# Patient Record
Sex: Male | Born: 1970 | Race: White | Hispanic: No | Marital: Single | Smoking: Current every day smoker
Health system: Southern US, Community
[De-identification: ages and names within clinical notes are randomized; demographics above are authoritative.]

## PROBLEM LIST (undated history)

## (undated) DIAGNOSIS — M5430 Sciatica, unspecified side: Secondary | ICD-10-CM

---

## 2017-04-02 ENCOUNTER — Emergency Department (HOSPITAL_COMMUNITY)
Admission: EM | Admit: 2017-04-02 | Discharge: 2017-04-02 | Disposition: A | Discharge status: Home / Self Care | Payer: Self-pay | Attending: Emergency Medicine | Admitting: Emergency Medicine

## 2017-04-02 ENCOUNTER — Other Ambulatory Visit: Payer: Self-pay

## 2017-04-02 ENCOUNTER — Emergency Department (HOSPITAL_COMMUNITY): Payer: Self-pay

## 2017-04-02 ENCOUNTER — Encounter (HOSPITAL_COMMUNITY): Payer: Self-pay

## 2017-04-02 DIAGNOSIS — F17210 Nicotine dependence, cigarettes, uncomplicated: Secondary | ICD-10-CM | POA: Insufficient documentation

## 2017-04-02 DIAGNOSIS — M545 Low back pain, unspecified: Secondary | ICD-10-CM

## 2017-04-02 DIAGNOSIS — G8929 Other chronic pain: Secondary | ICD-10-CM | POA: Insufficient documentation

## 2017-04-02 HISTORY — DX: Sciatica, unspecified side: M54.30

## 2017-04-02 MED ORDER — PREDNISONE 10 MG PO TABS: ORAL_TABLET | ORAL | 0 refills | Status: DC

## 2017-04-02 MED ORDER — CYCLOBENZAPRINE HCL 10 MG PO TABS: 10.0000 mg | ORAL_TABLET | Freq: Two times a day (BID) | ORAL | 0 refills | Status: DC | PRN

## 2017-04-02 NOTE — Discharge Instructions (Signed)
You can take Tylenol or Ibuprofen as directed for pain. You can alternate Tylenol and Ibuprofen every 4 hours. If you take Tylenol at 1pm, then you can take Ibuprofen at 5pm. Then you can take Tylenol again at 9pm.   Take Flexeril as prescribed. This medication will make you drowsy so do not drive or drink alcohol when taking it.  Take prednisone as directed.   [facility].  Follow-up with referral to neurosurgery for outpatient evaluation and further potential imaging.  Return to the Emergency Department immediately for any worsening back pain, neck pain, difficulty walking, numbness/weaknss of your arms or legs, urinary or bowel accidents, fever or any other worsening or concerning symptoms.

## 2017-04-02 NOTE — ED Provider Notes (Signed)
[facility] Provider Note   CSN: [id number] Arrival date & time: 04/02/17  2037     History   Chief Complaint Chief Complaint  Patient presents with  . Back Pain  . Sciatica    HPI Gabriel Sharp is a 46 y.o. male who presents with chronic left-sided back pain.  Patient reports that this is been ongoing for several years.  He was initially evaluated onset of symptoms and diagnosed with sciatica.  Patient reports that he has intermittent flares of left-sided back pain that radiates down the posterior aspect of his left leg.  Patient reports that he recently started a new job that requires a lot of physical activity and noticed that exacerbated his back pain.  Patient reports he has been taking ibuprofen for pain relief with minimal improvement.  Patient states that he has not had any trauma, injury, fall.  Patient reports that he has had some intermittent numbness to the left lower extremity but denies any current numbness at this time.  Patient states that this numbness is not new has been ongoing since he was initially diagnosed with sciatica.  Patient reports that he is able to ambulate but states that sometimes his pain is worsened with ambulation. Denies fevers, weight loss, numbness/weakness of upper and lower extremities, bowel/bladder incontinence, saddle anesthesia, history of back surgery, history of IVDA.  Patient denies any chest pain, difficulty breathing, abdominal pain, nausea/vomiting.   The history is provided by the patient.    Past Medical History:  Diagnosis Date  . Sciatica     There are no active problems to display for this patient.   History reviewed. No pertinent surgical history.     Home Medications    Prior to Admission medications   Medication Sig Start Date End Date Taking? Authorizing Provider  acetaminophen (TYLENOL) 325 MG tablet Take 975 mg every 6 (six) hours as needed by mouth for mild pain.   Yes  [provider]  ibuprofen (ADVIL,MOTRIN) 200 MG tablet Take 400-600 mg daily by mouth.   Yes [provider]  cyclobenzaprine (FLEXERIL) 10 MG tablet Take 1 tablet (10 mg total) 2 (two) times daily as needed by mouth for muscle spasms. 04/02/17   Maxwell CaulLayden, Amana Bouska A, PA-C  predniSONE (DELTASONE) 10 MG tablet Take 6 tabs day 1, 5 tabs day 2, 4 tabs day 3, 3 tabs day 4, 2 tabs day 5, and 1 on day 6 04/02/17   Maxwell CaulLayden, Desha Bitner A, PA-C    Family History History reviewed. No pertinent family history.  Social History Social History   Tobacco Use  . Smoking status: Current Every Day Smoker    Packs/day: 1.00    Types: Cigarettes  Substance Use Topics  . Alcohol use: Yes    Frequency: Never    Comment: occ  . Drug use: No     Allergies   Patient has no known allergies.   Review of Systems Review of Systems  Constitutional: Negative for fever and unexpected weight change.  Respiratory: Negative for shortness of breath.   Cardiovascular: Negative for chest pain.  Gastrointestinal: Negative for abdominal pain.  Musculoskeletal: Positive for back pain. Negative for neck pain.  Neurological: Positive for numbness (intermittent).     Physical Exam Updated Vital Signs BP (!) 126/94 (BP Location: Right Arm)   Pulse 89   Temp 98 F (36.7 C) (Oral)   Resp 14   Ht 5\' 11"  (1.803 m)   Wt 65.8 kg (145  lb)   SpO2 99%   BMI 20.22 kg/m   Physical Exam  Constitutional: He is oriented to person, place, and time. He appears well-developed and well-nourished.  Sitting comfortably on examination table  HENT:  Head: Normocephalic and atraumatic.  Mouth/Throat: Oropharynx is clear and moist and mucous membranes are normal.  Eyes: Conjunctivae, EOM and lids are normal. Pupils are equal, round, and reactive to light. Right eye exhibits no discharge. Left eye exhibits no discharge. No scleral icterus.  Neck: Full passive range of motion without pain.  Full flexion/extension  and lateral movement of neck fully intact. No bony midline tenderness. No deformities or crepitus.   Cardiovascular: Normal rate, regular rhythm, normal heart sounds and normal pulses.  Pulses:      Dorsalis pedis pulses are 2+ on the right side, and 2+ on the left side.  Pulmonary/Chest: Effort normal and breath sounds normal.  Abdominal: Soft. Normal appearance. There is no tenderness. There is no rigidity and no guarding.  Musculoskeletal: Normal range of motion.       Cervical back: He exhibits no tenderness.       Thoracic back: He exhibits no tenderness.       Lumbar back: He exhibits no tenderness.       Back:  No midline C, T, L-spine tenderness.  No deformities or crepitus noted.  Diffuse muscular tenderness overlying the left gluteal region.  Flexion/extension of back intact without difficulty.  Patient reports that pain radiates down posterior aspect of the left leg.  No tenderness palpation to left knee, left tib-fib.   Neurological: He is alert and oriented to person, place, and time.  Reflex Scores:      Patellar reflexes are 2+ on the right side and 2+ on the left side. Follows commands, Moves all extremities  5/5 strength to BUE and BLE  Sensation intact throughout all major nerve distributions of the BLE Normal gait  Skin: Skin is warm and dry. Capillary refill takes less than 2 seconds.  LLE is not dusky in appearance or cool to touch.  Psychiatric: He has a normal mood and affect. His speech is normal and behavior is normal.  Nursing note and vitals reviewed.    ED Treatments / Results  Labs (all labs ordered are listed, but only abnormal results are displayed) Labs Reviewed - No data to display  EKG  EKG Interpretation None       Radiology Dg Lumbar Spine Complete  Result Date: 04/02/2017 CLINICAL DATA:  Chronic low back pain.  Sciatica. EXAM: LUMBAR SPINE - COMPLETE 4+ VIEW COMPARISON:  None. FINDINGS: Five non rib-bearing lumbar-type vertebral bodies  are intact and aligned with maintenance of the lumbar lordosis. Moderate L3-4 disc height loss with endplate spurring, mild L4-5. No destructive bony lesions. Sacroiliac joints are symmetric. Included prevertebral and paraspinal soft tissue planes are non-suspicious. Phleboliths project in the pelvis. IMPRESSION: Moderate mid lumbar spondylosis. Electronically Signed   By: Awilda Metroourtnay  Bloomer M.D.   On: 04/02/2017 22:58    Procedures Procedures (including critical care time)  Medications Ordered in ED Medications - No data to display   Initial Impression / Assessment and Plan / ED Course  I have reviewed the triage vital signs and the nursing notes.  Pertinent labs & imaging results that were available during my care of the patient were reviewed by me and considered in my medical decision making (see chart for details).     46 year old male who presents with chronic left-sided back pain  and pain that radiates down the posterior aspect of his left leg.  Diagnosed with sciatica 2 years ago and initially symptoms presented.  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  Patient reports that he has some intermittent/tingling to the left lower extremity.  He denies any numbness or tingling today and states that this numbness has not been new that is been going on for the last 2 years.  No weakness.  Still I am able to ambulate without any difficulty.  No red flag symptoms.  No neuro deficits noted on exam. Pulses are equal in bilateral lower extremities.  They are well-perfused with good distal cap refill.  No evidence of dusky appearance or cool to touch.  Do not suspect arterial occlusion or aortic dissection based on history/physical exam.  History/physical exam not concerning for cauda equina or spinal abscess.  Patient denies any new changes noticed denies any numbness tonight.  No  neuro deficits noted on exam.  Given reassuring history/physical exam, I do not feel that patient needs an emergent MRI at this point.  I discussed at length with patient that at some point he may need an outpatient MRI for full evaluation of back pain.  Consider sciatica versus muscular strain versus chronic low back pain.  Review of patient's records that he brought with him from previous hospital shows diagnosis of sciatica.  He had no imaging done at that time. Will obtain lumbar imaging for further evaluation.  X-rays reviewed.  Negative for any acute fracture or dislocation.  No evidence of any acute abnormalities.  Because results with patient.  Will plan to treat as sciatica.  Patient instructed to [facility].  Patient had ample opportunity for questions and discussion. All patient's questions were answered with full understanding. Strict return precautions discussed. Patient expresses understanding and agreement to plan.    Final Clinical Impressions(s) / ED Diagnoses   Final diagnoses:  Chronic left-sided low back pain without sciatica    ED Discharge Orders        Ordered    predniSONE (DELTASONE) 10 MG tablet     04/02/17 2226    cyclobenzaprine (FLEXERIL) 10 MG tablet  2 times daily PRN     04/02/17 2226       Maxwell Caul, PA-C 04/03/17 1827    Loren Racer, MD 04/07/17 1501

## 2017-04-02 NOTE — ED Triage Notes (Signed)
Pt endorses lower back pain with radiation down the left leg and has been diagnosed with sciatica. Pt just moved here and does not have a dr. VSS. Pt ambulatory.

## 2017-04-02 NOTE — ED Notes (Signed)
Patient is A&Ox4 at this time.  Patient in no signs of distress.  Please see providers note for complete history and physical exam.  

## 2017-04-23 ENCOUNTER — Other Ambulatory Visit: Payer: Self-pay

## 2017-04-23 ENCOUNTER — Encounter (HOSPITAL_COMMUNITY): Payer: Self-pay | Admitting: Emergency Medicine

## 2017-04-23 DIAGNOSIS — M545 Low back pain: Secondary | ICD-10-CM | POA: Insufficient documentation

## 2017-04-23 DIAGNOSIS — Z5321 Procedure and treatment not carried out due to patient leaving prior to being seen by health care provider: Secondary | ICD-10-CM | POA: Insufficient documentation

## 2017-04-23 NOTE — ED Triage Notes (Signed)
Patient to ED c/o worsening lower back pain - pt was seen and treated a few weeks ago, x-ray showed disc degeneration L3/L4. Patient states they were told to see a neurologist, but pt doesn't have a PCP and had trouble getting a referral. Patient was told he needs MRI of his lumbar spine. Pt reports numbness and tingling to L leg (not new). Ambulatory with slow, steady gait. Denies urinary or bowel incontinence.

## 2017-04-24 ENCOUNTER — Emergency Department (HOSPITAL_COMMUNITY)
Admission: EM | Admit: 2017-04-24 | Discharge: 2017-04-24 | Disposition: A | Discharge status: Home / Self Care | Payer: Self-pay | Attending: Emergency Medicine | Admitting: Emergency Medicine

## 2017-04-24 DIAGNOSIS — Z5321 Procedure and treatment not carried out due to patient leaving prior to being seen by health care provider: Secondary | ICD-10-CM

## 2017-04-24 NOTE — ED Provider Notes (Signed)
Gabriel Sharp is a 46 y.o. male with a PMHx of sciatica, who presented to the ED with complaints of back pain, per nursing notes. Chart review reveals that he was seen on 04/02/17 in the ED for similar complaints, had lumbar xray which showed moderate mid-lumbar spondylosis; he was given flexeril and prednisone and discharged with recommendations to f[facility] and for further care of his chronic back pain.   Pt was roomed in Pod B/D and I assigned myself to his care, reviewed his chart, but then his name disappeared from the board. Per nursing notes, pt had left without being seen, after being triaged. PLEASE NOTE THAT HE WAS NOT SEEN BY MYSELF OR ANY OTHER EDP since he left after being triaged. No labs/imaging were ordered during this visit, I do not see a reason to call him back for emergent evaluation tonight.    [address], [place], [place] 04/24/17 [id number]    Dione BoozeGlick, David, MD 04/24/17 [phone]

## 2017-04-24 NOTE — ED Notes (Signed)
Called for patient x 3 with no response.  Rodman PickleBreana, NT states patient stated he was leaving earlier due to needing to work in the morning.  Will d/c.

## 2017-05-20 NOTE — Progress Notes (Signed)
Patient ID: Gabriel Sharp, male   DOB: 07/12/1970, 47 y.o.   MRN: [id number]     Gabriel Sharp, is a 47 y.o. adult  JYN:[id number]  [id number]  DOB - 07/12/1970  Subjective:  Chief Complaint and HPI: Gabriel Sharp is a 47 y.o. adult here today to establish care and for a follow up visit After going to the ED 04/24/2017 for back pain but leaving without being seen.  He had, however been seen on 04/02/2017 for LBP and L sciatica.  L-S xray films showed moderate lumbar spondylosis.  He continues to have LBP even after trials of prednisone, flexeril, mobic and gabapentin.  Back pain initially started about 1 year ago and has occurred on and off.  Since September, it has been bothering him more often than not.  Pain seems to have been worsened since starting a new job on November.  Lots of lifting and heavy equipment and vibrating machinery.  No numbness or weakenss but he does have pain in his L leg.    ED/Hospital notes reviewed.   Social History:  Smoker, working in Adult nurseindustrial/machinery Family history:  +heart disease.  No diabetes  ROS:   Constitutional:  No f/c, No night sweats, No unexplained weight loss. EENT:  No vision changes, No blurry vision, No hearing changes. No mouth, throat, or ear problems.  Respiratory: No cough, No SOB Cardiac: No CP, no palpitations GI:  No abd pain, No N/V/D. GU: No Urinary s/sx Musculoskeletal: +LBP Neuro: No headache, no dizziness, no motor weakness.  Skin: No rash Endocrine:  No polydipsia. No polyuria.  Psych: Denies SI/HI  No problems updated.  ALLERGIES: No Known Allergies  PAST MEDICAL HISTORY: Past Medical History:  Diagnosis Date  . Sciatica     MEDICATIONS AT HOME: Prior to Admission medications   Medication Sig Start Date End Date Taking? Authorizing Provider  meloxicam (MOBIC) 15 MG tablet Take 1 tablet (15 mg total) by mouth daily. X 10 days then prn pain 05/21/17   Anders SimmondsMcClung, Leslie Jester M, PA-C  methocarbamol (ROBAXIN) 500 MG tablet  Take 1 tablet (500 mg total) by mouth 3 (three) times daily. X 10 days then prn spasm 05/21/17   Anders SimmondsMcClung, Jaysie Benthall M, PA-C     Objective:  EXAM:   Vitals:   05/21/17 0846  BP: 117/84  Pulse: 82  Resp: 18  Temp: 98.3 F (36.8 C)  TempSrc: Oral  SpO2: 97%  Weight: 150 lb (68 kg)  Height: 6' (1.829 m)    General appearance : A&OX3. NAD. Non-toxic-appearing HEENT: Atraumatic and Normocephalic.  PERRLA. EOM intact.  Neck: supple, no JVD. No cervical lymphadenopathy. No thyromegaly Chest/Lungs:  Breathing-non-labored, Good air entry bilaterally, breath sounds normal without rales, rhonchi, or wheezing  CVS: S1 S2 regular, no murmurs, gallops, rubs  Extremities: Bilateral Lower Ext shows no edema, both legs are warm to touch with = pulse throughout Back:  No spiny TTP.  There is paraspinus spasm in the lumbar region.  +TTP over the S-I joint on the L.  Neg SLR B.  DTR=1+ BLE Neurology:  CN II-XII grossly intact, Non focal.   Psych:  TP linear. J/I WNL. Normal speech. Appropriate eye contact and affect.  Skin:  No Rash  Data Review No results found for: HGBA1C   Assessment & Plan   1. Chronic left-sided low back pain with left-sided sciatica No red flags.  xrays reviewed.  If no improvemnet, consider PT or MRI.  Patient is going to work on orange card -voltaren  75mg  bid X 10days then prn pain - methocarbamol (ROBAXIN) 500 MG tablet; Take 1 tablet (500 mg total) by mouth 3 (three) times daily. X 10 days then prn spasm  Dispense: 90 tablet; Refill: 0  2. Screening for diabetes mellitus Not diabetic in the event we need to prescribe steroids again - Glucose (CBG)   Patient have been counseled extensively about nutrition and exercise  Return in about 3 weeks (around 06/11/2017) for assign PCP; recheck LBP.  The patient was given clear instructions to go to ER or return to medical center if symptoms don't improve, worsen or new problems develop. The patient verbalized understanding.  The patient was told to call to get lab results if they haven't heard anything in the next week.     Georgian Co, PA-C [facility] [place], [place] [phone]   05/21/2017, 9:03 AM

## 2017-05-21 ENCOUNTER — Ambulatory Visit: Payer: Self-pay | Attending: Internal Medicine | Admitting: Physician Assistant

## 2017-05-21 VITALS — BP 117/84 | HR 82 | Temp 98.3°F | Resp 18 | Ht 72.0 in | Wt 150.0 lb

## 2017-05-21 DIAGNOSIS — G8929 Other chronic pain: Secondary | ICD-10-CM

## 2017-05-21 DIAGNOSIS — M5442 Lumbago with sciatica, left side: Secondary | ICD-10-CM | POA: Insufficient documentation

## 2017-05-21 DIAGNOSIS — Z131 Encounter for screening for diabetes mellitus: Secondary | ICD-10-CM | POA: Insufficient documentation

## 2017-05-21 DIAGNOSIS — F17200 Nicotine dependence, unspecified, uncomplicated: Secondary | ICD-10-CM | POA: Insufficient documentation

## 2017-05-21 LAB — GLUCOSE, POCT (MANUAL RESULT ENTRY): POC GLUCOSE: 98 mg/dL (ref 70–99)

## 2017-05-21 MED ORDER — MELOXICAM 15 MG PO TABS: 15.0000 mg | ORAL_TABLET | Freq: Every day | ORAL | 1 refills | Status: DC

## 2017-05-21 MED ORDER — DICLOFENAC SODIUM 75 MG PO TBEC: 75.0000 mg | DELAYED_RELEASE_TABLET | Freq: Two times a day (BID) | ORAL | 1 refills | Status: DC

## 2017-05-21 MED ORDER — METHOCARBAMOL 500 MG PO TABS: 500.0000 mg | ORAL_TABLET | Freq: Three times a day (TID) | ORAL | 0 refills | Status: DC

## 2017-05-21 NOTE — Patient Instructions (Addendum)
Sacroiliac Joint Dysfunction Sacroiliac joint dysfunction is a condition that causes inflammation on one or both sides of the sacroiliac (SI) joint. The SI joint connects the lower part of the spine (sacrum) with the two upper portions of the pelvis (ilium). This condition causes deep aching or burning pain in the low back. In some cases, the pain may also spread into one or both buttocks or hips or spread down the legs. What are the causes? This condition may be caused by:  Pregnancy. During pregnancy, extra stress is put on the SI joints because the pelvis widens.  Injury, such as: ? Car accidents. ? Sport-related injuries. ? Work-related injuries.  Having one leg that is shorter than the other.  Conditions that affect the joints, such as: ? Rheumatoid arthritis. ? Gout. ? Psoriatic arthritis. ? Joint infection (septic arthritis).  Sometimes, the cause of SI joint dysfunction is not known. What are the signs or symptoms? Symptoms of this condition include:  Aching or burning pain in the lower back. The pain may also spread to other areas, such as: ? Buttocks. ? Groin. ? Thighs and legs.  Muscle spasms in or around the painful areas.  Increased pain when standing, walking, running, stair climbing, bending, or lifting.  How is this diagnosed? Your health care provider will do a physical exam and take your medical history. During the exam, the health care provider may move one or both of your legs to different positions to check for pain. Various tests may be done to help verify the diagnosis, including:  Imaging tests to look for other causes of pain. These may include: ? MRI. ? CT scan. ? Bone scan.  Diagnostic injection. A numbing medicine is injected into the SI joint using a needle. If the pain is temporarily improved or stopped after the injection, this can indicate that SI joint dysfunction is the problem.  How is this treated? Treatment may vary depending on the  cause and severity of your condition. Treatment options may include:  Applying ice or heat to the lower back area. This can help to reduce pain and muscle spasms.  Medicines to relieve pain or inflammation or to relax the muscles.  Wearing a back brace (sacroiliac brace) to help support the joint while your back is healing.  Physical therapy to increase muscle strength around the joint and flexibility at the joint. This may also involve learning proper body positions and ways of moving to relieve stress on the joint.  Direct manipulation of the SI joint.  Injections of steroid medicine into the joint in order to reduce pain and swelling.  Radiofrequency ablation to burn away nerves that are carrying pain messages from the joint.  Use of a device that provides electrical stimulation in order to reduce pain at the joint.  Surgery to put in screws and plates that limit or prevent joint motion. This is rare.  Follow these instructions at home:  Rest as needed. Limit your activities as directed by your health care provider.  Take medicines only as directed by your health care provider.  If directed, apply ice to the affected area: ? Put ice in a plastic bag. ? Place a towel between your skin and the bag. ? Leave the ice on for 20 minutes, 2-3 times per day.  Use a heating pad or a moist heat pack as directed by your health care provider.  Exercise as directed by your health care provider or physical therapist.  Keep all follow-up visits   as directed by your health care provider. This is important. Contact a health care provider if:  Your pain is not controlled with medicine.  You have a fever.  You have increasingly severe pain. Get help right away if:  You have weakness, numbness, or tingling in your legs or feet.  You lose control of your bladder or bowel. This information is not intended to replace advice given to you by your health care provider. Make sure you discuss  any questions you have with your health care provider. Document Released: 08/01/2008 Document Revised: 10/11/2015 Document Reviewed: 01/10/2014 Elsevier Interactive Patient Education  2018 [employer] Low Back Sprain A sprain is a stretch or tear in the bands of tissue that hold bones and joints together (ligaments). Sprains of the lower back (lumbar spine) are a common cause of low back pain. A sprain occurs when ligaments are overextended or stretched beyond their limits. The ligaments can become inflamed, resulting in pain and sudden muscle tightening (spasms). A sprain can be caused by an injury (trauma), or it can develop gradually due to overuse. There are three types of sprains:  Grade 1 is a mild sprain involving an overstretched ligament or a very slight tear of the ligament.  Grade 2 is a moderate sprain involving a partial tear of the ligament.  Grade 3 is a severe sprain involving a complete tear of the ligament.  What are the causes? This condition may be caused by:  Trauma, such as a fall or a hit to the body.  Twisting or overstretching the back. This may result from doing activities that require a lot of energy, such as lifting heavy objects.  What increases the risk? The following factors may increase your risk of getting this condition:  Playing contact sports.  Participating in sports or activities that put excessive stress on the back and require a lot of bending and twisting, including: ? Lifting weights or heavy objects. ? Gymnastics. ? Soccer. ? Figure skating. ? Snowboarding.  Being overweight or obese.  Having poor strength and flexibility.  What are the signs or symptoms? Symptoms of this condition may include:  Sharp or dull pain in the lower back that does not go away. Pain may extend to the buttocks.  Stiffness.  Limited range of motion.  Inability to stand up straight due to stiffness or pain.  Muscle spasms.  How is this  diagnosed?  This condition may be diagnosed based on:  Your symptoms.  Your medical history.  A physical exam. ? Your health care provider may push on certain areas of your back to determine the source of your pain. ? You may be asked to bend forward, backward, and side to side to assess the severity of your pain and your range of motion.  Imaging tests, such as: ? X-rays. ? MRI.  How is this treated? Treatment for this condition may include:  Applying heat and cold to the affected area.  Medicines to help relieve pain and to relax your muscles (muscle relaxants).  NSAIDs to help reduce swelling and discomfort.  Physical therapy.  When your symptoms improve, it is important to gradually return to your normal routine as soon as possible to reduce pain, avoid stiffness, and avoid loss of muscle strength. Generally, symptoms should improve within 6 weeks of treatment. However, recovery time varies. Follow these instructions at home: Managing pain, stiffness, and swelling  If directed, apply ice to the injured area during the first 24 hours after your  injury. ? Put ice in a plastic bag. ? Place a towel between your skin and the bag. ? Leave the ice on for 20 minutes, 2-3 times a day.  If directed, apply heat to the affected area as often as told by your health care provider. Use the heat source that your health care provider recommends, such as a moist heat pack or a heating pad. ? Place a towel between your skin and the heat source. ? Leave the heat on for 20-30 minutes. ? Remove the heat if your skin turns bright red. This is especially important if you are unable to feel pain, heat, or cold. You may have a greater risk of getting burned. Activity  Rest and return to your normal activities as told by your health care provider. Ask your health care provider what activities are safe for you.  Avoid activities that take a lot of effort (are strenuous) for as long as told by  your health care provider.  Do exercises as told by your health care provider. General instructions   Take over-the-counter and prescription medicines only as told by your health care provider.  If you have questions or concerns about safety while taking pain medicine, talk with your health care provider.  Do not drive or operate heavy machinery until you know how your pain medicine affects you.  Do not use any tobacco products, such as cigarettes, chewing tobacco, and e-cigarettes. Tobacco can delay bone healing. If you need help quitting, ask your health care provider.  Keep all follow-up visits as told by your health care provider. This is important. How is this prevented?  Warm up and stretch before being active.  Cool down and stretch after being active.  Give your body time to rest between periods of activity.  Avoid: ? Being physically inactive for long periods at a time. ? Exercising or playing sports when you are tired or in pain.  Use correct form when playing sports and lifting heavy objects.  Use good posture when sitting and standing.  Maintain a healthy weight.  Sleep on a mattress with medium firmness to support your back.  Make sure to use equipment that fits you, including shoes that fit well.  Be safe and responsible while being active to avoid falls.  Do at least 150 minutes of moderate-intensity exercise each week, such as brisk walking or water aerobics. Try a form of exercise that takes stress off your back, such as swimming or stationary cycling.  Maintain physical fitness, including: ? Strength. In particular, develop and maintain strong abdominal muscles. ? Flexibility. ? Cardiovascular fitness. ? Endurance. Contact a health care provider if:  Your back pain does not improve after 6 weeks of treatment.  Your symptoms get worse. Get help right away if:  Your back pain is severe.  You are unable to stand or walk.  You develop pain in  your legs.  You develop weakness in your buttocks or legs.  You have difficulty controlling when you urinate or when you have a bowel movement. This information is not intended to replace advice given to you by your health care provider. Make sure you discuss any questions you have with your health care provider. Document Released: 05/05/2005 Document Revised: 01/10/2016 Document Reviewed: 02/14/2015 Elsevier Interactive Patient Education  [employer].

## 2017-06-12 ENCOUNTER — Ambulatory Visit: Payer: Self-pay | Attending: Nurse Practitioner | Admitting: Nurse Practitioner

## 2017-06-12 ENCOUNTER — Encounter: Payer: Self-pay | Admitting: Nurse Practitioner

## 2017-06-12 ENCOUNTER — Ambulatory Visit: Payer: Self-pay

## 2017-06-12 VITALS — BP 143/93 | HR 81 | Temp 98.0°F | Resp 12 | Ht 71.0 in | Wt 150.2 lb

## 2017-06-12 DIAGNOSIS — M5442 Lumbago with sciatica, left side: Secondary | ICD-10-CM | POA: Insufficient documentation

## 2017-06-12 DIAGNOSIS — F17200 Nicotine dependence, unspecified, uncomplicated: Secondary | ICD-10-CM | POA: Insufficient documentation

## 2017-06-12 DIAGNOSIS — R030 Elevated blood-pressure reading, without diagnosis of hypertension: Secondary | ICD-10-CM | POA: Insufficient documentation

## 2017-06-12 DIAGNOSIS — G8929 Other chronic pain: Secondary | ICD-10-CM

## 2017-06-12 DIAGNOSIS — Z716 Tobacco abuse counseling: Secondary | ICD-10-CM | POA: Insufficient documentation

## 2017-06-12 DIAGNOSIS — Z79899 Other long term (current) drug therapy: Secondary | ICD-10-CM | POA: Insufficient documentation

## 2017-06-12 DIAGNOSIS — M5440 Lumbago with sciatica, unspecified side: Secondary | ICD-10-CM

## 2017-06-12 MED ORDER — DULOXETINE HCL 30 MG PO CPEP: ORAL_CAPSULE | ORAL | 0 refills | Status: DC

## 2017-06-12 MED ORDER — DULOXETINE HCL 30 MG PO CPEP: ORAL_CAPSULE | ORAL | 3 refills | Status: DC

## 2017-06-12 NOTE — Progress Notes (Signed)
Assessment & Plan:  Gabriel Sharp was seen today for establish care.  Diagnoses and all orders for this visit:  Elevated blood pressure reading DASH/Mediterranean Diets are healthier choices for HTN.  STOP SMOKING  Chronic left-sided low back pain with left-sided sciatica -     DULoxetine (CYMBALTA) 30 MG capsule; Take one (1) capsule by mouth once daily for one week then increase to 2 (two) tablets once a day after one week.   Tobacco dependence Gabriel Sharp was counseled on the dangers of tobacco use, and was advised to quit. Reviewed strategies to maximize success, including removing cigarettes and smoking materials from environment, stress management and support of family/friends as well as pharmacological alternatives including: Wellbutrin, Chantix, Nicotine patch, Nicotine gum or lozenges. Smoking cessation support: smoking cessation hotline: 1-800-QUIT-NOW.  Smoking cessation classes are also available through [facility]. Call [phone] or visit our website at [email or web address].   Spent 3 minutes counseling on smoking cessation and patient is not ready to quit.  Patient has been counseled on age-appropriate routine health concerns for screening and prevention. These are reviewed and up-to-date. Referrals have been placed accordingly. Immunizations are up-to-date or declined.    Subjective:   Chief Complaint  Patient presents with  . Establish Care    Patient is here to establish care for his lower back pain. Patient stated the pain radiates down to his leg feel like needles poking. Patient stated the lower back pain started since 2017 and is ongoing.    HPI Gabriel Sharp 47 y.o. male presents to office today to establish care. He has complaints of back pain and has a history of sciatica based on review of records. He is here today with his ex girlfriend whom he is currently living with. He has no other significant PMH.   Back Pain Patient presents for presents  evaluation of low back problems.  Symptoms have been present for 1 year and include back pain with sciatica. Initial inciting event: he had a skateboarding accident at the age of 12 and reports "sprained/fractured discs" in the lower back. He did not have physical therapy thereafter or any type of rehab or orthopedic consult.  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 He does not wear a back brace. He reports low back pain with stabbing pain in "my left butt cheek" then it radiates down to his left leg with numbness and burning.   Symptoms are worst: all day. Alleviating factors identifiable by patient are rest. Exacerbating factors identifiable by patient are walking, standing for more than 2-3 minutes at at time, bending over. Treatments so far initiated by patient: none Previous lower back problems: see injury above. Previous workup: xrays. . Previous treatments: voltaren, robaxin, flexeril, ibuprofen, mobic, prednisone, gabapentin.   XRAY LUMBAR SPINE 04-02-2017 FINDINGS: Five non rib-bearing lumbar-type vertebral bodies are intact and aligned with maintenance of the lumbar lordosis. Moderate L3-4 disc height loss with endplate spurring, mild L4-5. No destructive bony lesions.  Sacroiliac joints are symmetric. Included prevertebral and paraspinal soft tissue planes are non-suspicious. Phleboliths project in the pelvis.  IMPRESSION: Moderate mid lumbar spondylosis.  Elevated blood pressure He does not have a history of hypertension. Could likely be related to pain. Will recheck at next office visit in a few weeks. Denies chest pain, shortness of breath, palpitations, lightheadedness, dizziness, headaches or BLE edema.     Review of Systems  Constitutional: Negative for fever, malaise/fatigue and weight loss.  HENT: Negative  for congestion and sinus pain.        Rhinorrhea  Eyes: Negative.   Respiratory: Positive for cough. Negative for sputum  production, shortness of breath and wheezing.   Cardiovascular: Negative.  Negative for chest pain, orthopnea and leg swelling.  Gastrointestinal: Negative for abdominal pain.  Genitourinary: Negative.   Musculoskeletal: Positive for back pain and myalgias.       SEE HPI  Neurological: Positive for tingling, sensory change and weakness. Negative for dizziness, focal weakness, seizures, loss of consciousness and headaches.  Psychiatric/Behavioral: Negative.  Negative for depression, hallucinations, substance abuse and suicidal ideas. The patient is not nervous/anxious.     Past Medical History:  Diagnosis Date  . Sciatica     History reviewed. No pertinent surgical history.  Family History  Problem Relation Age of Onset  . Heart disease Mother   . Heart disease Maternal Uncle   . Heart disease Paternal Uncle   . Heart disease Maternal Grandmother   . Heart disease Paternal Grandmother     Social History Reviewed with no changes to be made today.   Outpatient Medications Prior to Visit  Medication Sig Dispense Refill  . diclofenac (VOLTAREN) 75 MG EC tablet Take 1 tablet (75 mg total) by mouth 2 (two) times daily. X 10 days then prn pain 60 tablet 1  . methocarbamol (ROBAXIN) 500 MG tablet Take 1 tablet (500 mg total) by mouth 3 (three) times daily. X 10 days then prn spasm 90 tablet 0   No facility-administered medications prior to visit.     No Known Allergies     Objective:    BP (!) 143/93 (BP Location: Right Arm, Patient Position: Sitting, Cuff Size: Normal)   Pulse 81   Temp 98 F (36.7 C) (Oral)   Resp 12   Ht 5\' 11"  (1.803 m)   Wt 150 lb 3.2 oz (68.1 kg)   SpO2 97%   BMI 20.95 kg/m  Wt Readings from Last 3 Encounters:  06/12/17 150 lb 3.2 oz (68.1 kg)  05/21/17 150 lb (68 kg)  04/23/17 152 lb (68.9 kg)    Physical Exam  Constitutional: He is oriented to person, place, and time. He appears well-developed and well-nourished. He is cooperative.  HENT:    Head: Normocephalic and atraumatic.  Eyes: EOM are normal.  Neck: Normal range of motion.  Cardiovascular: Normal rate, regular rhythm and normal heart sounds. Exam reveals no gallop and no friction rub.  No murmur heard. Pulmonary/Chest: Effort normal and breath sounds normal. No tachypnea. No respiratory distress. He has no decreased breath sounds. He has no wheezes. He has no rhonchi. He has no rales. He exhibits no tenderness.  Musculoskeletal: Normal range of motion. He exhibits no edema.       Lumbar back: He exhibits tenderness, swelling and pain. He exhibits no edema, no deformity, no laceration and no spasm.       Back:  Neurological: He is alert and oriented to person, place, and time. He has normal strength. He displays no atrophy. No sensory deficit. He exhibits normal muscle tone. Coordination normal.  Skin: Skin is warm and dry.  Psychiatric: He has a normal mood and affect. His behavior is normal. Judgment and thought content normal.  Nursing note and vitals reviewed.        Patient has been counseled extensively about nutrition and exercise as well as the importance of adherence with medications and regular follow-up. The patient was given clear instructions to go to  ER or return to medical center if symptoms don't improve, worsen or new problems develop. The patient verbalized understanding.   Follow-up: Return in about 3 weeks (around 07/03/2017) for f/u back pain and fasting labs.   Claiborne RiggZelda W Fleming, FNP-BC [facility] [place], [place] [phone]   06/12/2017, 9:38 AM

## 2017-06-12 NOTE — Patient Instructions (Addendum)
Chronic Back Pain When back pain lasts longer than 3 months, it is called chronic back pain.The cause of your back pain may not be known. Some common causes include:  Wear and tear (degenerative disease) of the bones, ligaments, or disks in your back.  Inflammation and stiffness in your back (arthritis).  People who have chronic back pain often go through certain periods in which the pain is more intense (flare-ups). Many people can learn to manage the pain with home care. Follow these instructions at home: Pay attention to any changes in your symptoms. Take these actions to help with your pain: Activity  Avoid bending and activities that make the problem worse.  Do not sit or stand in one place for long periods of time.  Take brief periods of rest throughout the day. This will reduce your pain. Resting in a lying or standing position is usually better than sitting to rest.  When you are resting for longer periods, mix in some mild activity or stretching between periods of rest. This will help to prevent stiffness and pain.  Get regular exercise. Ask your health care provider what activities are safe for you.  Do not lift anything that is heavier than 10 lb (4.5 kg). Always use proper lifting technique, which includes: ? Bending your knees. ? Keeping the load close to your body. ? Avoiding twisting. Managing pain  If directed, apply ice to the painful area. Your health care provider may recommend applying ice during the first 24-48 hours after a flare-up begins. ? Put ice in a plastic bag. ? Place a towel between your skin and the bag. ? Leave the ice on for 20 minutes, 2-3 times per day.  After icing, apply heat to the affected area as often as told by your health care provider. Use the heat source that your health care provider recommends, such as a moist heat pack or a heating pad. ? Place a towel between your skin and the heat source. ? Leave the heat on for 20-30  minutes. ? Remove the heat if your skin turns bright red. This is especially important if you are unable to feel pain, heat, or cold. You may have a greater risk of getting burned.  Try soaking in a warm tub.  Take over-the-counter and prescription medicines only as told by your health care provider.  Keep all follow-up visits as told by your health care provider. This is important. Contact a health care provider if:  You have pain that is not relieved with rest or medicine. Get help right away if:  You have weakness or numbness in one or both of your legs or feet.  You have trouble controlling your bladder or your bowels.  You have nausea or vomiting.  You have pain in your abdomen.  You have shortness of breath or you faint. This information is not intended to replace advice given to you by your health care provider. Make sure you discuss any questions you have with your health care provider. Document Released: 06/12/2004 Document Revised: 09/13/2015 Document Reviewed: 10/23/2014 Elsevier Interactive Patient Education  2018 [employer]  Sciatica Sciatica is pain, numbness, weakness, or tingling along your sciatic nerve. The sciatic nerve starts in the lower back and goes down the back of each leg. Sciatica happens when this nerve is pinched or has pressure put on it. Sciatica usually goes away on its own or with treatment. Sometimes, sciatica may keep coming back (recur). Follow these instructions at home: Medicines    Take over-the-counter and prescription medicines only as told by your doctor.  Do not drive or use heavy machinery while taking prescription pain medicine. Managing pain  If directed, put ice on the affected area. ? Put ice in a plastic bag. ? Place a towel between your skin and the bag. ? Leave the ice on for 20 minutes, 2-3 times a day.  After icing, apply heat to the affected area before you exercise or as often as told by your doctor. Use the heat source  that your doctor tells you to use, such as a moist heat pack or a heating pad. ? Place a towel between your skin and the heat source. ? Leave the heat on for 20-30 minutes. ? Remove the heat if your skin turns bright red. This is especially important if you are unable to feel pain, heat, or cold. You may have a greater risk of getting burned. Activity  Return to your normal activities as told by your doctor. Ask your doctor what activities are safe for you. ? Avoid activities that make your sciatica worse.  Take short rests during the day. Rest in a lying or standing position. This is usually better than sitting to rest. ? When you rest for a long time, do some physical activity or stretching between periods of rest. ? Avoid sitting for a long time without moving. Get up and move around at least one time each hour.  Exercise and stretch regularly, as told by your doctor.  Do not lift anything that is heavier than 10 lb (4.5 kg) while you have symptoms of sciatica. ? Avoid lifting heavy things even when you do not have symptoms. ? Avoid lifting heavy things over and over.  When you lift objects, always lift in a way that is safe for your body. To do this, you should: ? Bend your knees. ? Keep the object close to your body. ? Avoid twisting. General instructions  Use good posture. ? Avoid leaning forward when you are sitting. ? Avoid hunching over when you are standing.  Stay at a healthy weight.  Wear comfortable shoes that support your feet. Avoid wearing high heels.  Avoid sleeping on a mattress that is too soft or too hard. You might have less pain if you sleep on a mattress that is firm enough to support your back.  Keep all follow-up visits as told by your doctor. This is important. Contact a doctor if:  You have pain that: ? Wakes you up when you are sleeping. ? Gets worse when you lie down. ? Is worse than the pain you have had in the past. ? Lasts longer than 4  weeks.  You lose weight for without trying. Get help right away if:  You cannot control when you pee (urinate) or poop (have a bowel movement).  You have weakness in any of these areas and it gets worse. ? Lower back. ? Lower belly (pelvis). ? Butt (buttocks). ? Legs.  You have redness or swelling of your back.  You have a burning feeling when you pee. This information is not intended to replace advice given to you by your health care provider. Make sure you discuss any questions you have with your health care provider. Document Released: 02/12/2008 Document Revised: 10/11/2015 Document Reviewed: 01/12/2015 Elsevier Interactive Patient Education  2018 [employer]  

## 2017-07-03 ENCOUNTER — Encounter: Payer: Self-pay | Admitting: Nurse Practitioner

## 2017-07-03 ENCOUNTER — Ambulatory Visit: Payer: Self-pay | Attending: Nurse Practitioner | Admitting: Nurse Practitioner

## 2017-07-03 VITALS — BP 117/80 | HR 82 | Temp 98.5°F | Ht 71.0 in | Wt 150.0 lb

## 2017-07-03 DIAGNOSIS — M549 Dorsalgia, unspecified: Secondary | ICD-10-CM | POA: Insufficient documentation

## 2017-07-03 DIAGNOSIS — Z8249 Family history of ischemic heart disease and other diseases of the circulatory system: Secondary | ICD-10-CM | POA: Insufficient documentation

## 2017-07-03 DIAGNOSIS — F17200 Nicotine dependence, unspecified, uncomplicated: Secondary | ICD-10-CM

## 2017-07-03 DIAGNOSIS — R110 Nausea: Secondary | ICD-10-CM | POA: Insufficient documentation

## 2017-07-03 DIAGNOSIS — Z0000 Encounter for general adult medical examination without abnormal findings: Secondary | ICD-10-CM

## 2017-07-03 DIAGNOSIS — M47896 Other spondylosis, lumbar region: Secondary | ICD-10-CM | POA: Insufficient documentation

## 2017-07-03 DIAGNOSIS — M47816 Spondylosis without myelopathy or radiculopathy, lumbar region: Secondary | ICD-10-CM

## 2017-07-03 DIAGNOSIS — M5136 Other intervertebral disc degeneration, lumbar region: Secondary | ICD-10-CM | POA: Insufficient documentation

## 2017-07-03 DIAGNOSIS — Z79899 Other long term (current) drug therapy: Secondary | ICD-10-CM | POA: Insufficient documentation

## 2017-07-03 MED ORDER — CYCLOBENZAPRINE HCL 10 MG PO TABS: 10.0000 mg | ORAL_TABLET | Freq: Two times a day (BID) | ORAL | 1 refills | Status: AC | PRN

## 2017-07-03 MED ORDER — IBUPROFEN 800 MG PO TABS: 800.0000 mg | ORAL_TABLET | Freq: Three times a day (TID) | ORAL | 1 refills | Status: DC | PRN

## 2017-07-03 NOTE — Patient Instructions (Addendum)
Back Exercises If you have pain in your back, do these exercises 2-3 times each day or as told by your doctor. When the pain goes away, do the exercises once each day, but repeat the steps more times for each exercise (do more repetitions). If you do not have pain in your back, do these exercises once each day or as told by your doctor. Exercises Single Knee to Chest  Do these steps 3-5 times in a row for each leg: 1. Lie on your back on a firm bed or the floor with your legs stretched out. 2. Bring one knee to your chest. 3. Hold your knee to your chest by grabbing your knee or thigh. 4. Pull on your knee until you feel a gentle stretch in your lower back. 5. Keep doing the stretch for 10-30 seconds. 6. Slowly let go of your leg and straighten it.  Pelvic Tilt  Do these steps 5-10 times in a row: 1. Lie on your back on a firm bed or the floor with your legs stretched out. 2. Bend your knees so they point up to the ceiling. Your feet should be flat on the floor. 3. Tighten your lower belly (abdomen) muscles to press your lower back against the floor. This will make your tailbone point up to the ceiling instead of pointing down to your feet or the floor. 4. Stay in this position for 5-10 seconds while you gently tighten your muscles and breathe evenly.  Cat-Cow  Do these steps until your lower back bends more easily: 1. Get on your hands and knees on a firm surface. Keep your hands under your shoulders, and keep your knees under your hips. You may put padding under your knees. 2. Let your head hang down, and make your tailbone point down to the floor so your lower back is round like the back of a cat. 3. Stay in this position for 5 seconds. 4. Slowly lift your head and make your tailbone point up to the ceiling so your back hangs low (sags) like the back of a cow. 5. Stay in this position for 5 seconds.  Press-Ups  Do these steps 5-10 times in a row: 1. Lie on your belly (face-down)  on the floor. 2. Place your hands near your head, about shoulder-width apart. 3. While you keep your back relaxed and keep your hips on the floor, slowly straighten your arms to raise the top half of your body and lift your shoulders. Do not use your back muscles. To make yourself more comfortable, you may change where you place your hands. 4. Stay in this position for 5 seconds. 5. Slowly return to lying flat on the floor.  Bridges  Do these steps 10 times in a row: 1. Lie on your back on a firm surface. 2. Bend your knees so they point up to the ceiling. Your feet should be flat on the floor. 3. Tighten your butt muscles and lift your butt off of the floor until your waist is almost as high as your knees. If you do not feel the muscles working in your butt and the back of your thighs, slide your feet 1-2 inches farther away from your butt. 4. Stay in this position for 3-5 seconds. 5. Slowly lower your butt to the floor, and let your butt muscles relax.  If this exercise is too easy, try doing it with your arms crossed over your chest. Belly Crunches  Do these steps 5-10 times in   a row: 1. Lie on your back on a firm bed or the floor with your legs stretched out. 2. Bend your knees so they point up to the ceiling. Your feet should be flat on the floor. 3. Cross your arms over your chest. 4. Tip your chin a little bit toward your chest but do not bend your neck. 5. Tighten your belly muscles and slowly raise your chest just enough to lift your shoulder blades a tiny bit off of the floor. 6. Slowly lower your chest and your head to the floor.  Back Lifts Do these steps 5-10 times in a row: 1. Lie on your belly (face-down) with your arms at your sides, and rest your forehead on the floor. 2. Tighten the muscles in your legs and your butt. 3. Slowly lift your chest off of the floor while you keep your hips on the floor. Keep the back of your head in line with the curve in your back. Look at  the floor while you do this. 4. Stay in this position for 3-5 seconds. 5. Slowly lower your chest and your face to the floor.  Contact a doctor if:  Your back pain gets a lot worse when you do an exercise.  Your back pain does not lessen 2 hours after you exercise. If you have any of these problems, stop doing the exercises. Do not do them again unless your doctor says it is okay. Get help right away if:  You have sudden, very bad back pain. If this happens, stop doing the exercises. Do not do them again unless your doctor says it is okay. This information is not intended to replace advice given to you by your health care provider. Make sure you discuss any questions you have with your health care provider. Document Released: 06/07/2010 Document Revised: 10/11/2015 Document Reviewed: 06/29/2014 Elsevier Interactive Patient Education  2018 Elsevier Inc.  Back Pain, Adult Back pain is very common. The pain often gets better over time. The cause of back pain is usually not dangerous. Most people can learn to manage their back pain on their own. Follow these instructions at home: Watch your back pain for any changes. The following actions may help to lessen any pain you are feeling:  Stay active. Start with short walks on flat ground if you can. Try to walk farther each day.  Exercise regularly as told by your doctor. Exercise helps your back heal faster. It also helps avoid future injury by keeping your muscles strong and flexible.  Do not sit, drive, or stand in one place for more than 30 minutes.  Do not stay in bed. Resting more than 1-2 days can slow down your recovery.  Be careful when you bend or lift an object. Use good form when lifting: ? Bend at your knees. ? Keep the object close to your body. ? Do not twist.  Sleep on a firm mattress. Lie on your side, and bend your knees. If you lie on your back, put a pillow under your knees.  Take medicines only as told by your  doctor.  Put ice on the injured area. ? Put ice in a plastic bag. ? Place a towel between your skin and the bag. ? Leave the ice on for 20 minutes, 2-3 times a day for the first 2-3 days. After that, you can switch between ice and heat packs.  Avoid feeling anxious or stressed. Find good ways to deal with stress, such as exercise.    Maintain a healthy weight. Extra weight puts stress on your back.  Contact a doctor if:  You have pain that does not go away with rest or medicine.  You have worsening pain that goes down into your legs or buttocks.  You have pain that does not get better in one week.  You have pain at night.  You lose weight.  You have a fever or chills. Get help right away if:  You cannot control when you poop (bowel movement) or pee (urinate).  Your arms or legs feel weak.  Your arms or legs lose feeling (numbness).  You feel sick to your stomach (nauseous) or throw up (vomit).  You have belly (abdominal) pain.  You feel like you may pass out (faint). This information is not intended to replace advice given to you by your health care provider. Make sure you discuss any questions you have with your health care provider. Document Released: 10/22/2007 Document Revised: 10/11/2015 Document Reviewed: 09/06/2013 Elsevier Interactive Patient Education  2018 Elsevier Inc.  

## 2017-07-03 NOTE — Progress Notes (Signed)
Assessment & Plan:  Gabriel Sharp was seen today for back pain and medication problem.  Diagnoses and all orders for this visit:  Spondylosis of lumbar spine -     ibuprofen (ADVIL,MOTRIN) 800 MG tablet; Take 1 tablet (800 mg total) by mouth every 8 (eight) hours as needed. -     cyclobenzaprine (FLEXERIL) 10 MG tablet; Take 1 tablet (10 mg total) by mouth 2 (two) times daily as needed for muscle spasms.  Other intervertebral disc degeneration, lumbar region -     MR Lumbar Spine Wo Contrast; Future  Routine adult health maintenance -     CBC -     Lipid panel -     Basic metabolic panel -     VITAMIN D 25 Hydroxy (Vit-D Deficiency, Fractures)  Tobacco dependence    Patient has been counseled on age-appropriate routine health concerns for screening and prevention. These are reviewed and up-to-date. Referrals have been placed accordingly. Immunizations are up-to-date or declined.    Subjective:   Chief Complaint  Patient presents with  . Back Pain    Patient is here for back pain follow-up. Patient stated the pain still there and comes and go. No improvement. Patient been taking OTC pain relieves.   . Medication Problem    Patient stated he stop taking Gabriel due to make him feels Sharp   HPI Gabriel Sharp 47 y.o. male presents to office today for follow up to back pain.   Gabriel Sharp. He was taking it with food and it did not provide relief. At his last appointment with me he was prescribed Gabriel for poorly controlled back pain. He endorses taking it for 3 days and had nausea each time so he stopped taking it. He does not wear a back brace at work. States it causes more back pain when he tries to drive the machinery at work while wearing a brace. He continues to smoke although we have spoken about the correlation between pain receptors and smoking. He declines any smoking cessation aids. Xray 03-2017 showed lumbar spondylosis.    Back  Pain Chronic >1year and include sciatica into left hip and gluteal area. Pain is located in the left lumbar region of his spine. He endorses the pain as constant. Aggravating factors: activity.  Per previous notes:  Initial inciting event: he had a skateboarding accident at the age of 78 and reports "sprained/fractured discs" in the lower back. He did not have physical therapy thereafter or any type of rehab or orthopedic consult.  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  Previous treatments: Gabriel, voltaren, robaxin, flexeril, ibuprofen, mobic, prednisone, gabapentin.   Review of Systems  Constitutional: Negative.  Negative for chills, fever, malaise/fatigue and weight loss.  Respiratory: Negative.  Negative for cough, sputum production and shortness of breath.   Cardiovascular: Negative.  Negative for chest pain, orthopnea and leg swelling.  Gastrointestinal: Negative for constipation and diarrhea.       No bladder or bowel incontinence  Musculoskeletal: Positive for back pain, joint pain and myalgias. Negative for falls.  Neurological: Positive for tingling. Negative for dizziness, speech change, focal weakness and headaches.    Past Medical History:  Diagnosis Date  . Sciatica     History reviewed. No pertinent surgical history.  Family History  Problem Relation Age of Onset  . Heart disease Mother   . Heart disease Maternal Uncle   . Heart disease Paternal  Uncle   . Heart disease Maternal Grandmother   . Heart disease Paternal Grandmother     Social History Reviewed with no changes to be made today.   Outpatient Medications Prior to Visit  Medication Sig Dispense Refill  . diclofenac (VOLTAREN) 75 MG EC tablet Take 1 tablet (75 mg total) by mouth 2 (two) times daily. X 10 days then prn pain (Patient not taking: Reported on 07/03/2017) 60 tablet 1  . DULoxetine (Gabriel) 30 MG capsule Take one (1) capsule by mouth once daily for  one week then increase to 2 (two) tablets once a day after one week. (Patient not taking: Reported on 07/03/2017) 49 capsule 0  . methocarbamol (ROBAXIN) 500 MG tablet Take 1 tablet (500 mg total) by mouth 3 (three) times daily. X 10 days then prn spasm (Patient not taking: Reported on 07/03/2017) 90 tablet 0   No facility-administered medications prior to visit.     No Known Allergies     Objective:    BP 117/80 (BP Location: Right Arm, Patient Position: Sitting, Cuff Size: Normal)   Pulse 82   Temp 98.5 F (36.9 C) (Oral)   Ht 5\' 11"  (1.803 m)   Wt 150 lb (68 kg)   SpO2 95%   BMI 20.92 kg/m  Wt Readings from Last 3 Encounters:  07/03/17 150 lb (68 kg)  06/12/17 150 lb 3.2 oz (68.1 kg)  05/21/17 150 lb (68 kg)    Physical Exam  Constitutional: He is oriented to person, place, and time. He appears well-developed and well-nourished.  HENT:  Head: Normocephalic.  Eyes: EOM are normal.  Neck: Normal range of motion.  Cardiovascular: Normal rate, regular rhythm and normal heart sounds. Exam reveals no gallop and no friction rub.  No murmur heard. Pulmonary/Chest: Effort normal and breath sounds normal. No respiratory distress. He has no wheezes. He has no rales. He exhibits no tenderness.  Musculoskeletal:       Thoracic back: He exhibits pain.       Lumbar back: He exhibits pain.       Back:  Pain with firm palpation of left thoracolumbar area  Neurological: He is alert and oriented to person, place, and time.  Skin: Skin is warm and dry.  Psychiatric: He has a normal mood and affect. His behavior is normal. Judgment and thought content normal.      Patient has been counseled extensively about nutrition and exercise as well as the importance of adherence with medications and regular follow-up. The patient was given clear instructions to go to ER or return to medical center if symptoms don't improve, worsen or new problems develop. The patient verbalized understanding.    Follow-up: Return in about 3 months (around 09/30/2017).   Claiborne RiggZelda W Noorah Giammona, FNP-BC [facility] [place], [place] [phone]   07/03/2017, 11:03 AM

## 2017-07-04 LAB — LIPID PANEL
CHOLESTEROL TOTAL: 195 mg/dL (ref 100–199)
Chol/HDL Ratio: 5 ratio (ref 0.0–5.0)
HDL: 39 mg/dL — ABNORMAL LOW (ref >39)
LDL Calculated: 137 mg/dL — ABNORMAL HIGH (ref 0–99)
Triglycerides: 93 mg/dL (ref 0–149)
VLDL Cholesterol Cal: 19 mg/dL (ref 5–40)

## 2017-07-04 LAB — BASIC METABOLIC PANEL
BUN/Creatinine Ratio: 11 (ref 9–20)
BUN: 11 mg/dL (ref 6–24)
CALCIUM: 9.3 mg/dL (ref 8.7–10.2)
CO2: 25 mmol/L (ref 20–29)
CREATININE: 0.97 mg/dL (ref 0.76–1.27)
Chloride: 103 mmol/L (ref 96–106)
GFR calc Af Amer: 109 mL/min/{1.73_m2} (ref >59)
GFR, EST NON AFRICAN AMERICAN: 95 mL/min/{1.73_m2} (ref >59)
GLUCOSE: 90 mg/dL (ref 65–99)
POTASSIUM: 4.1 mmol/L (ref 3.5–5.2)
Sodium: 143 mmol/L (ref 134–144)

## 2017-07-04 LAB — VITAMIN D 25 HYDROXY (VIT D DEFICIENCY, FRACTURES): Vit D, 25-Hydroxy: 21.8 ng/mL — ABNORMAL LOW (ref 30.0–100.0)

## 2017-07-04 LAB — CBC
Hematocrit: 47.1 % (ref 37.5–51.0)
Hemoglobin: 16 g/dL (ref 13.0–17.7)
MCH: 30.5 pg (ref 26.6–33.0)
MCHC: 34 g/dL (ref 31.5–35.7)
MCV: 90 fL (ref 79–97)
Platelets: 270 10*3/uL (ref 150–379)
RBC: 5.24 x10E6/uL (ref 4.14–5.80)
RDW: 13.2 % (ref 12.3–15.4)
WBC: 7.2 10*3/uL (ref 3.4–10.8)

## 2017-07-06 ENCOUNTER — Telehealth: Payer: Self-pay

## 2017-07-06 NOTE — Telephone Encounter (Signed)
-----   Message from Claiborne RiggZelda W Fleming, NP sent at 07/04/2017  2:16 PM EST ----- Labs are essentially normal. Your LDL cholesterol is elevated at 137 with goal less than 99. Make sure you are drinking at least 48 oz of water per day. Work on eating a low fat, heart healthy diet and participate in regular aerobic exercise program to control as well. No fried foods. No junk foods, sodas, sugary drinks, unhealthy snacking, or smoking. Vitamin D is slightly low. You can take over the counter vitamin D. 800-1000units daily

## 2017-07-06 NOTE — Telephone Encounter (Signed)
CMA attempt to call patient regarding results. No answer. Left a voicemail for patient to call back.   If patient call please let patient know:   Labs are essentially normal. Your LDL cholesterol is elevated at 137 with goal less than 99. Make sure you are drinking at least 48 oz of water per day. Work on eating a low fat, heart healthy diet and participate in regular aerobic exercise program to control as well. No fried foods. No junk foods, sodas, sugary drinks, unhealthy snacking, or smoking. Vitamin D is slightly low. You can take over the counter vitamin D. 800-1000units daily

## 2017-07-14 ENCOUNTER — Ambulatory Visit (HOSPITAL_COMMUNITY): Payer: Self-pay

## 2017-07-16 ENCOUNTER — Ambulatory Visit (HOSPITAL_COMMUNITY)
Admission: RE | Admit: 2017-07-16 | Discharge: 2017-07-16 | Disposition: A | Discharge status: Home / Self Care | Payer: Self-pay | Source: Ambulatory Visit | Attending: Nurse Practitioner | Admitting: Nurse Practitioner

## 2017-07-16 DIAGNOSIS — M5126 Other intervertebral disc displacement, lumbar region: Secondary | ICD-10-CM | POA: Insufficient documentation

## 2017-07-16 DIAGNOSIS — M48061 Spinal stenosis, lumbar region without neurogenic claudication: Secondary | ICD-10-CM | POA: Insufficient documentation

## 2017-07-16 DIAGNOSIS — M5136 Other intervertebral disc degeneration, lumbar region: Secondary | ICD-10-CM | POA: Insufficient documentation

## 2017-07-19 ENCOUNTER — Other Ambulatory Visit: Payer: Self-pay | Admitting: Nurse Practitioner

## 2017-07-19 DIAGNOSIS — M5136 Other intervertebral disc degeneration, lumbar region: Secondary | ICD-10-CM

## 2017-07-19 DIAGNOSIS — M5126 Other intervertebral disc displacement, lumbar region: Secondary | ICD-10-CM

## 2017-07-20 ENCOUNTER — Telehealth: Payer: Self-pay

## 2017-07-20 NOTE — Telephone Encounter (Signed)
CMA attempt to call patient to inform on MRI results. Patient have active [facility] and [employer].   No answer, and left a voicemail for patient to call back.  If patient call back please inform: MRI results shows bulging disc. Referral for Neurology has been made and will reach out to patient once it is complete.

## 2017-07-20 NOTE — Telephone Encounter (Signed)
-----   Message from Claiborne RiggZelda W Fleming, NP sent at 07/19/2017  9:08 PM EST ----- Your MRI shows a bulging disc. I will submit a referral to neurology. However you will need to make sure you have spoken to the financial counselor and make sure you have the orange card and/or [facility] discount before you can be seen by a specialist.

## 2017-07-22 ENCOUNTER — Telehealth: Payer: Self-pay | Admitting: Nurse Practitioner

## 2017-07-22 NOTE — Telephone Encounter (Signed)
Patient called and requested for MRI results. Patient understood and had no further questions.

## 2017-07-27 ENCOUNTER — Other Ambulatory Visit: Payer: Self-pay

## 2017-07-27 ENCOUNTER — Emergency Department (HOSPITAL_COMMUNITY)
Admission: EM | Admit: 2017-07-27 | Discharge: 2017-07-27 | Disposition: A | Discharge status: Home / Self Care | Payer: Self-pay | Attending: Emergency Medicine | Admitting: Emergency Medicine

## 2017-07-27 ENCOUNTER — Encounter (HOSPITAL_COMMUNITY): Payer: Self-pay

## 2017-07-27 DIAGNOSIS — F17210 Nicotine dependence, cigarettes, uncomplicated: Secondary | ICD-10-CM | POA: Insufficient documentation

## 2017-07-27 DIAGNOSIS — M545 Low back pain, unspecified: Secondary | ICD-10-CM

## 2017-07-27 MED ORDER — PREDNISONE 10 MG PO TABS: 60.0000 mg | ORAL_TABLET | Freq: Every day | ORAL | 0 refills | Status: AC

## 2017-07-27 NOTE — ED Provider Notes (Signed)
[facility] EMERGENCY DEPARTMENT Provider Note   CSN: [id number] Arrival date & time: 07/27/17  1008     History   Chief Complaint Chief Complaint  Patient presents with  . Back Pain    HPI Gabriel Sharp is a 47 y.o. male.  HPI Patient with known herniated disc diagnosed on MRI in February.  He presents with ongoing back pain not improved with ibuprofen and muscle relaxants.  He has referral to a neurologist.  He states his pain is worsening of the past 4 days.  No fevers or chills.  Denies weakness of his legs.  No new injury.  No other complaints.  Denies weakness or numbness.  Symptoms are moderate in severity   Past Medical History:  Diagnosis Date  . Sciatica     Patient Active Problem List   Diagnosis Date Noted  . Chronic low back pain with sciatica 06/12/2017    History reviewed. No pertinent surgical history.     Home Medications    Prior to Admission medications   Medication Sig Start Date End Date Taking? Authorizing Provider  cyclobenzaprine (FLEXERIL) 10 MG tablet Take 1 tablet (10 mg total) by mouth 2 (two) times daily as needed for muscle spasms. 07/03/17 08/02/17 Yes Claiborne Rigg, NP  ibuprofen (ADVIL,MOTRIN) 800 MG tablet Take 1 tablet (800 mg total) by mouth every 8 (eight) hours as needed. 07/03/17  Yes Claiborne Rigg, NP  predniSONE (DELTASONE) 10 MG tablet Take 6 tablets (60 mg total) by mouth daily for 5 days. 07/27/17 08/01/17  Azalia Bilis, MD    Family History Family History  Problem Relation Age of Onset  . Heart disease Mother   . Heart disease Maternal Uncle   . Heart disease Paternal Uncle   . Heart disease Maternal Grandmother   . Heart disease Paternal Grandmother     Social History Social History   Tobacco Use  . Smoking status: Current Every Day Smoker    Packs/day: 1.00    Types: Cigarettes  . Smokeless tobacco: Never Used  Substance Use Topics  . Alcohol use: Yes    Frequency: Never    Comment:  occ  . Drug use: No     Allergies   Patient has no known allergies.   Review of Systems Review of Systems  All other systems reviewed and are negative.    Physical Exam Updated Vital Signs BP 107/83 (BP Location: Right Arm)   Pulse 85   Temp 99.1 F (37.3 C) (Oral)   Resp 18   SpO2 98%   Physical Exam  Constitutional: He is oriented to person, place, and time. He appears well-developed and well-nourished.  HENT:  Head: Normocephalic.  Eyes: EOM are normal.  Neck: Normal range of motion.  Pulmonary/Chest: Effort normal.  Abdominal: He exhibits no distension.  Musculoskeletal: Normal range of motion.  No thoracic or lumbar point tenderness.  Normal movement of lower extremities equally bilateral  Neurological: He is alert and oriented to person, place, and time.  Psychiatric: He has a normal mood and affect.  Nursing note and vitals reviewed.    ED Treatments / Results  Labs (all labs ordered are listed, but only abnormal results are displayed) Labs Reviewed - No data to display  EKG  EKG Interpretation None       Radiology No results found.  Procedures Procedures (including critical care time)  Medications Ordered in ED Medications - No data to display   Initial Impression / Assessment  and Plan / ED Course  I have reviewed the triage vital signs and the nursing notes.  Pertinent labs & imaging results that were available during my care of the patient were reviewed by me and considered in my medical decision making (see chart for details).     Placed on a course of steroids.  Recommended follow-up with a chiropractor as well as ongoing referral to subspecialist.  Close primary care follow-up.  No indication for repeat imaging.  Doubt cauda equina.  Overall well-appearing.  Final Clinical Impressions(s) / ED Diagnoses   Final diagnoses:  Lumbar back pain    ED Discharge Orders        Ordered    predniSONE (DELTASONE) 10 MG tablet  Daily      07/27/17 1301       Azalia Bilisampos, Kashis Penley, MD 07/27/17 1305

## 2017-07-27 NOTE — ED Triage Notes (Signed)
Pt was dx with a bulging disc in his back. He has been taking ibuprofen and flexeril. He is waiting to see neurologist. Pt here today for pain management.

## 2018-04-26 ENCOUNTER — Other Ambulatory Visit: Payer: Self-pay

## 2018-04-26 ENCOUNTER — Encounter (HOSPITAL_COMMUNITY): Payer: Self-pay | Admitting: Emergency Medicine

## 2018-04-26 ENCOUNTER — Emergency Department (HOSPITAL_COMMUNITY)
Admission: EM | Admit: 2018-04-26 | Discharge: 2018-04-27 | Disposition: A | Discharge status: Home / Self Care | Payer: Self-pay | Attending: Emergency Medicine | Admitting: Emergency Medicine

## 2018-04-26 DIAGNOSIS — F17210 Nicotine dependence, cigarettes, uncomplicated: Secondary | ICD-10-CM | POA: Insufficient documentation

## 2018-04-26 DIAGNOSIS — A6001 Herpesviral infection of penis: Secondary | ICD-10-CM | POA: Insufficient documentation

## 2018-04-26 LAB — URINALYSIS, ROUTINE W REFLEX MICROSCOPIC
BILIRUBIN URINE: NEGATIVE
Bacteria, UA: NONE SEEN
Glucose, UA: NEGATIVE mg/dL
KETONES UR: NEGATIVE mg/dL
LEUKOCYTES UA: NEGATIVE
Nitrite: NEGATIVE
PROTEIN: NEGATIVE mg/dL
SPECIFIC GRAVITY, URINE: 1.002 — AB (ref 1.005–1.030)
pH: 6 (ref 5.0–8.0)

## 2018-04-26 MED ORDER — ACYCLOVIR 400 MG PO TABS: 400.0000 mg | ORAL_TABLET | Freq: Three times a day (TID) | ORAL | 0 refills | Status: DC

## 2018-04-26 NOTE — ED Provider Notes (Signed)
[facility] Provider Note   CSN: [id number] Arrival date & time: 04/26/18  2215     History   Chief Complaint Chief Complaint  Patient presents with  . Dysuria    HPI Gabriel Sharp is a 47 y.o. male.  Patient presents to the emergency department with a chief complaint of dysuria.  States that his significant other had a sore near her vagina several days ago.  States that soon thereafter he began to have a sore on his penis, which she describes as looking like a pustule.  He states that there is some surrounding redness.  He denies any discharge.  He reports significant pain with urination.  Denies any suprapubic or abdominal pain.  Denies any fevers, chills, nausea, or vomiting.  No treatments prior to arrival.  The history is provided by the patient. No language interpreter was used.    Past Medical History:  Diagnosis Date  . Sciatica     Patient Active Problem List   Diagnosis Date Noted  . Chronic low back pain with sciatica 06/12/2017    History reviewed. No pertinent surgical history.      Home Medications    Prior to Admission medications   Medication Sig Start Date End Date Taking? Authorizing Provider  ibuprofen (ADVIL,MOTRIN) 800 MG tablet Take 1 tablet (800 mg total) by mouth every 8 (eight) hours as needed. 07/03/17   Claiborne RiggFleming, Zelda W, NP    Family History Family History  Problem Relation Age of Onset  . Heart disease Mother   . Heart disease Maternal Uncle   . Heart disease Paternal Uncle   . Heart disease Maternal Grandmother   . Heart disease Paternal Grandmother     Social History Social History   Tobacco Use  . Smoking status: Current Every Day Smoker    Packs/day: 1.50    Types: Cigarettes  . Smokeless tobacco: Never Used  Substance Use Topics  . Alcohol use: Yes    Frequency: Never    Comment: occ  . Drug use: No     Allergies   Patient has no known allergies.   Review of Systems Review  of Systems  All other systems reviewed and are negative.    Physical Exam Updated Vital Signs There were no vitals taken for this visit.  Physical Exam  Constitutional: He is oriented to person, place, and time. No distress.  HENT:  Head: Normocephalic and atraumatic.  Eyes: Pupils are equal, round, and reactive to light. Conjunctivae and EOM are normal.  Neck: No tracheal deviation present.  Cardiovascular: Normal rate.  Pulmonary/Chest: Effort normal. No respiratory distress.  Abdominal: Soft.  Genitourinary:  Genitourinary Comments: There is erythema around the urethral meatus, with possibly recently ruptured vesicle, which could be herpetiform, no other lesions on the penis, or scrotum, there is clear discharge, no masses  Musculoskeletal: Normal range of motion.  Neurological: He is alert and oriented to person, place, and time.  Skin: Skin is warm and dry. He is not diaphoretic.  Psychiatric: Judgment normal.  Nursing note and vitals reviewed.    ED Treatments / Results  Labs (all labs ordered are listed, but only abnormal results are displayed) Labs Reviewed - No data to display  EKG None  Radiology No results found.  Procedures Procedures (including critical care time)  Medications Ordered in ED Medications - No data to display   Initial Impression / Assessment and Plan / ED Course  I have reviewed the triage  vital signs and the nursing notes.  Pertinent labs & imaging results that were available during my care of the patient were reviewed by me and considered in my medical decision making (see chart for details).    Patient with dysuria.  He complains of a sore at the tip of his penis.  Appears to have perhaps been ruptured vesicle and herpetiform in nature.  Gonorrhea and chlamydia sample sent.  Urinalysis pending.  UA shows small HGB, but no other significant findings.  Will treat for herpes.  G/C cultures pending.  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

## 2018-04-26 NOTE — ED Triage Notes (Signed)
Pt reports burning dysuria that started about 2 days ago. Pt has a "zit-like" wound to penis that has a "slit to it" and inflammed. Denies any drainage. 10/10 pain.

## 2018-04-27 LAB — GC/CHLAMYDIA PROBE AMP (~~LOC~~) NOT AT ARMC
Chlamydia: NEGATIVE
Neisseria Gonorrhea: NEGATIVE

## 2018-04-27 NOTE — ED Notes (Signed)
See EDP assessment 

## 2018-04-27 NOTE — ED Notes (Signed)
Pt verbalizes understanding of d/c instructions. Prescriptions reviewed with patient. Pt ambulatory at d/c with all belongings and with family.   

## 2018-10-14 ENCOUNTER — Emergency Department (HOSPITAL_COMMUNITY)
Admission: EM | Admit: 2018-10-14 | Discharge: 2018-10-14 | Disposition: A | Discharge status: Home / Self Care | Payer: Self-pay | Attending: Emergency Medicine | Admitting: Emergency Medicine

## 2018-10-14 ENCOUNTER — Other Ambulatory Visit: Payer: Self-pay

## 2018-10-14 DIAGNOSIS — M545 Low back pain, unspecified: Secondary | ICD-10-CM

## 2018-10-14 DIAGNOSIS — F17210 Nicotine dependence, cigarettes, uncomplicated: Secondary | ICD-10-CM | POA: Insufficient documentation

## 2018-10-14 DIAGNOSIS — Z79899 Other long term (current) drug therapy: Secondary | ICD-10-CM | POA: Insufficient documentation

## 2018-10-14 MED ORDER — OXYCODONE-ACETAMINOPHEN 5-325 MG PO TABS: 1.0000 | ORAL_TABLET | Freq: Four times a day (QID) | ORAL | 0 refills | Status: DC | PRN

## 2018-10-14 MED ORDER — PREDNISONE 10 MG PO TABS: ORAL_TABLET | ORAL | 0 refills | Status: DC

## 2018-10-14 MED ORDER — OXYCODONE-ACETAMINOPHEN 5-325 MG PO TABS
1.0000 | ORAL_TABLET | Freq: Once | ORAL | Status: AC
  Administered 2018-10-14: 12:00:00 1 via ORAL
  Filled 2018-10-14: qty 1

## 2018-10-14 NOTE — ED Triage Notes (Signed)
Patient c/o back pain worsening on Tuesday. Patient reports he has bulging disc in L3,4,5. Patient denies any numbness or tingling or bladder/bowel issues.

## 2018-10-14 NOTE — ED Provider Notes (Signed)
[facility] EMERGENCY DEPARTMENT Provider Note   CSN: [id number] Arrival date & time: 10/14/18  1132    History   Chief Complaint Chief Complaint  Patient presents with  . Back Pain    HPI Gabriel Sharp is a 48 y.o. male.     HPI   48 year old male presents today with complaints of low back pain.  Patient notes a history of chronic back pain.  Usually he is able to manage this at home without any medications.  Patient notes that on Tuesday he woke with pain in his low mid back.  He notes this is in a different location than his previous back pain.  He notes that usually his pain radiates down through his left leg and causes weakness.  He denies any distal neurological deficits including numbness tingling or weakness.  Patient denies any abdominal pain.  Denies any fever, IV drug use or trauma.  He is uncertain what flared his back pain.  He has not tried medications today.    Past Medical History:  Diagnosis Date  . Sciatica     Patient Active Problem List   Diagnosis Date Noted  . Chronic low back pain with sciatica 06/12/2017    No past surgical history on file.      Home Medications    Prior to Admission medications   Medication Sig Start Date End Date Taking? Authorizing Provider  acyclovir (ZOVIRAX) 400 MG tablet Take 1 tablet (400 mg total) by mouth 3 (three) times daily. 04/26/18   Roxy Horseman, PA-C  ibuprofen (ADVIL,MOTRIN) 800 MG tablet Take 1 tablet (800 mg total) by mouth every 8 (eight) hours as needed. 07/03/17   Claiborne Rigg, NP  oxyCODONE-acetaminophen (PERCOCET/ROXICET) 5-325 MG tablet Take 1 tablet by mouth every 6 (six) hours as needed. 10/14/18   Prentice Sackrider, Tinnie Gens, PA-C  predniSONE (DELTASONE) 10 MG tablet Please take 6 tabs daily for 5 days. Day six 5 tabs, Day seven 4 tabs, Day eight 3 tabs, Day nine 2 tabs, Day ten 1 tab 10/14/18   Eyvonne Mechanic, PA-C    Family History Family History  Problem Relation Age of Onset  . Heart  disease Mother   . Heart disease Maternal Uncle   . Heart disease Paternal Uncle   . Heart disease Maternal Grandmother   . Heart disease Paternal Grandmother     Social History Social History   Tobacco Use  . Smoking status: Current Every Day Smoker    Packs/day: 1.50    Types: Cigarettes  . Smokeless tobacco: Never Used  Substance Use Topics  . Alcohol use: Yes    Frequency: Never    Comment: occ  . Drug use: No     Allergies   Patient has no known allergies.   Review of Systems Review of Systems  All other systems reviewed and are negative.    Physical Exam Updated Vital Signs BP 118/80 (BP Location: Right Arm)   Pulse 86   Temp 98.3 F (36.8 C) (Oral)   Resp 16   Ht 5\' 11"  (1.803 m)   Wt 68 kg   SpO2 98%   BMI 20.92 kg/m   Physical Exam Vitals signs and nursing note reviewed.  Constitutional:      Appearance: He is well-developed.  HENT:     Head: Normocephalic and atraumatic.  Eyes:     General: No scleral icterus.       Right eye: No discharge.  Left eye: No discharge.     Conjunctiva/sclera: Conjunctivae normal.     Pupils: Pupils are equal, round, and reactive to light.  Neck:     Musculoskeletal: Normal range of motion.     Vascular: No JVD.     Trachea: No tracheal deviation.  Pulmonary:     Effort: Pulmonary effort is normal.     Breath sounds: No stridor.  Abdominal:     General: There is no distension.     Palpations: Abdomen is soft.     Tenderness: There is no abdominal tenderness.  Musculoskeletal:     Comments: No C or T-spine tenderness palpation, tenderness palpation of mid lower lumbar spine and surrounding soft tissue, no redness swelling or warmth-straight leg negative bilateral, no decreased sensation to the lower extremities-right 5 out of 5 to the lower extremities  Neurological:     Mental Status: He is alert and oriented to person, place, and time.     Coordination: Coordination normal.  Psychiatric:         Behavior: Behavior normal.        Thought Content: Thought content normal.        Judgment: Judgment normal.      ED Treatments / Results  Labs (all labs ordered are listed, but only abnormal results are displayed) Labs Reviewed - No data to display  EKG None  Radiology No results found.  Procedures Procedures (including critical care time)  Medications Ordered in ED Medications  oxyCODONE-acetaminophen (PERCOCET/ROXICET) 5-325 MG per tablet 1 tablet (1 tablet Oral Given 10/14/18 1223)     Initial Impression / Assessment and Plan / ED Course  I have reviewed the triage vital signs and the nursing notes.  Pertinent labs & imaging results that were available during my care of the patient were reviewed by me and considered in my medical decision making (see chart for details).        Labs:   Imaging:  Consults:  Therapeutics:  Discharge Meds:   Assessment/Plan: 48 year old male presents today with low back pain.  Low suspicion for acute fracture no signs of infection, no other red flags.  Patient will be discharged with symptomatic care and outpatient follow-up.  He is given strict return precautions.  Verbalized understanding and agreement to today's plan had no further questions or concerns at time of discharge.  Drug database reviewed, no narcotic prescriptions   Final Clinical Impressions(s) / ED Diagnoses   Final diagnoses:  Acute low back pain without sciatica, unspecified back pain laterality    ED Discharge Orders         Ordered    predniSONE (DELTASONE) 10 MG tablet     10/14/18 1250    oxyCODONE-acetaminophen (PERCOCET/ROXICET) 5-325 MG tablet  Every 6 hours PRN     10/14/18 1250           Eyvonne MechanicHedges, Jobin Montelongo, PA-C 10/14/18 1251    Blane OharaZavitz, Joshua, MD 10/14/18 1546

## 2018-10-14 NOTE — Discharge Instructions (Addendum)
Please read attached information. If you experience any new or worsening signs or symptoms please return to the emergency room for evaluation. Please follow-up with your primary care provider or specialist as discussed. Please use medication prescribed only as directed and discontinue taking if you have any concerning signs or symptoms.   °

## 2019-05-31 IMAGING — MR MR LUMBAR SPINE W/O CM
4 of 5 series · 19 of 48 positions shown · non-contrast
Comparison: None.

CLINICAL DATA: Lumbar pain.  Symptoms for 1 year.

EXAM:
MRI LUMBAR SPINE WITHOUT CONTRAST
TECHNIQUE: Multiplanar, multisequence MR imaging of the lumbar spine was
performed. No intravenous contrast was administered.

[Series 3: T2 · sagittal · 4.0mm · 0.55mm/px · 5 of 13 slices shown (1 of 2)]
[im 1/13]
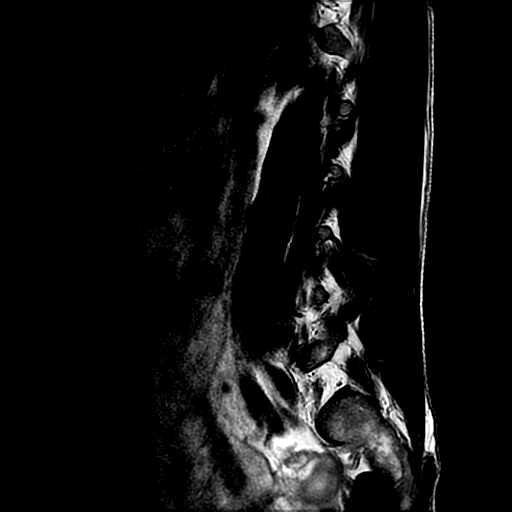
[im 4/13]
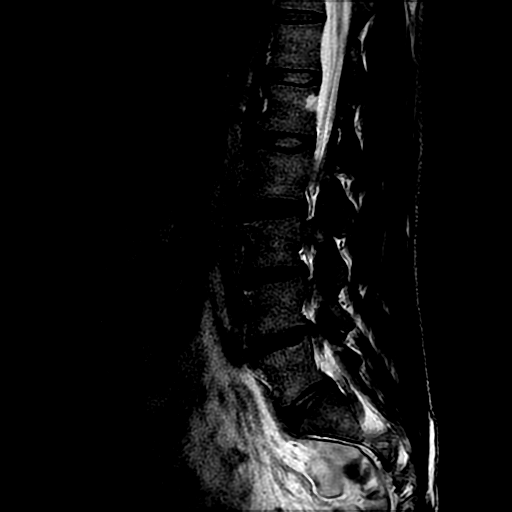
[im 7/13]
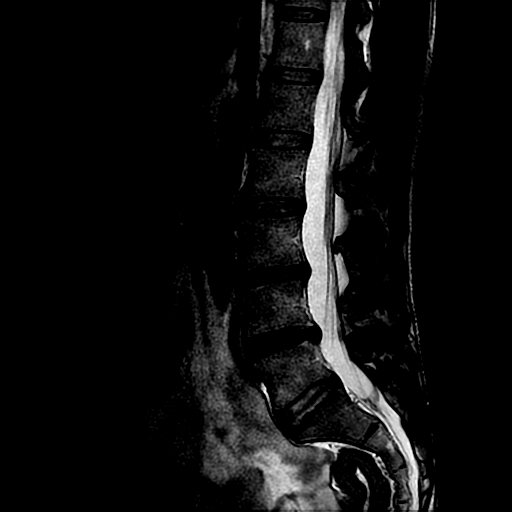
[im 10/13]
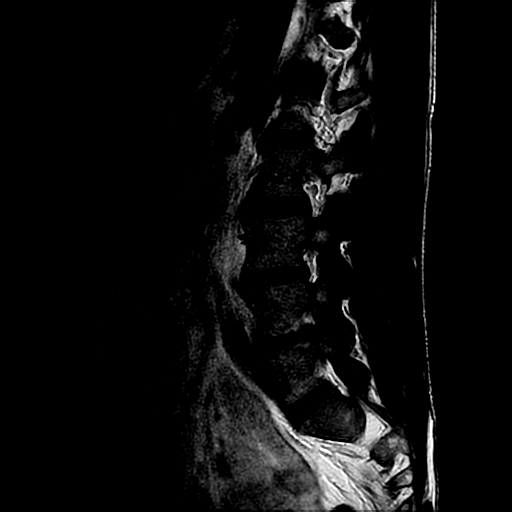
[im 13/13]
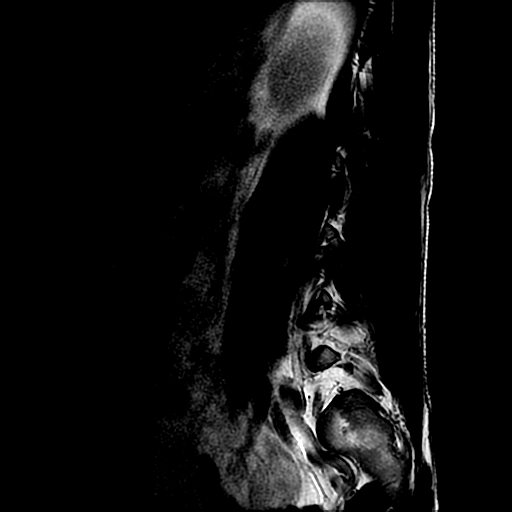

[Series 4: T1 · sagittal · 4.0mm · 0.55mm/px · 3 of 13 slices shown (1 of 2)]
[im 1/13]
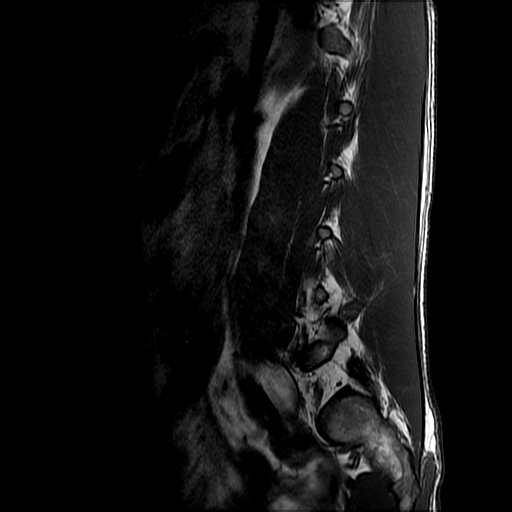
[im 7/13]
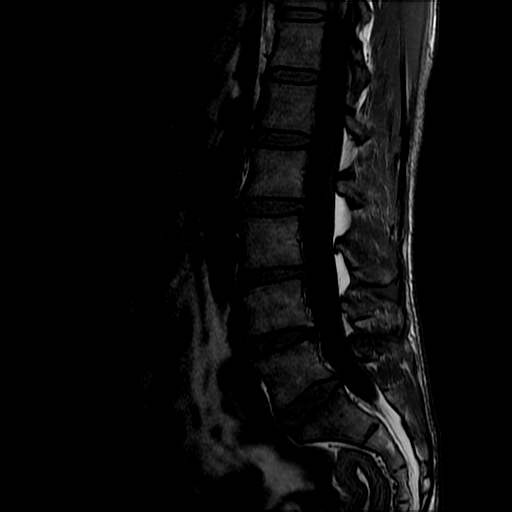
[im 13/13]
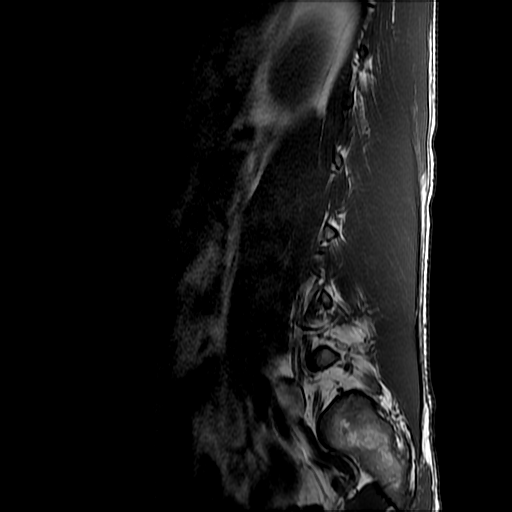

[Series 6: T2 · axial · 4.0mm · 0.39mm/px · z∈[-112,+34]mm · 8 of 36 slices shown (2 of 2)]
[im 3/36]
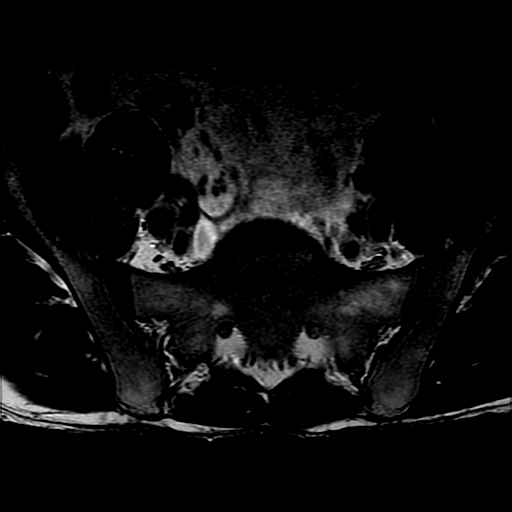
[im 5/36]
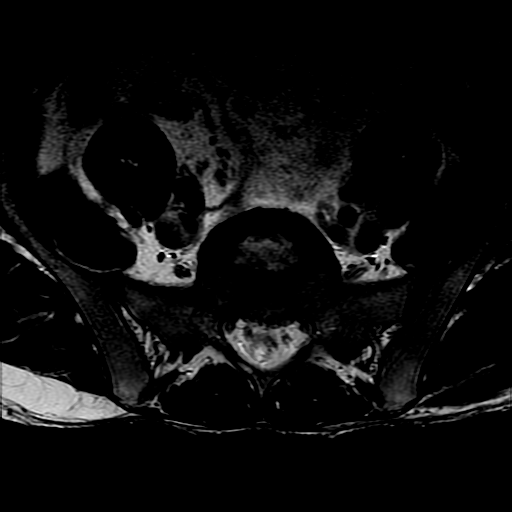
[im 8/36]
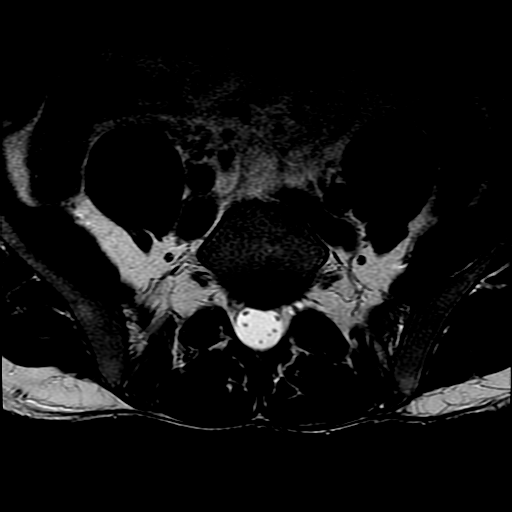
[im 12/36]
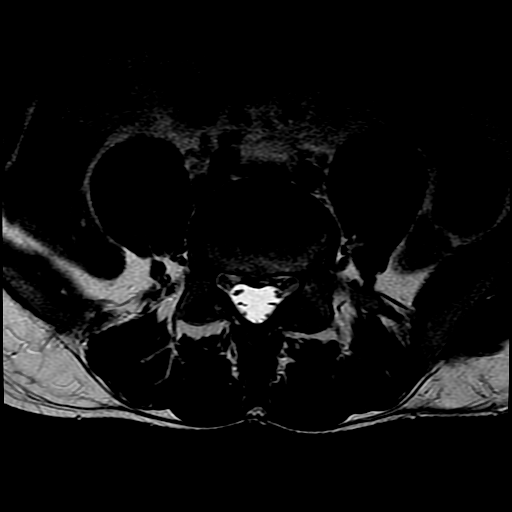
[im 17/36]
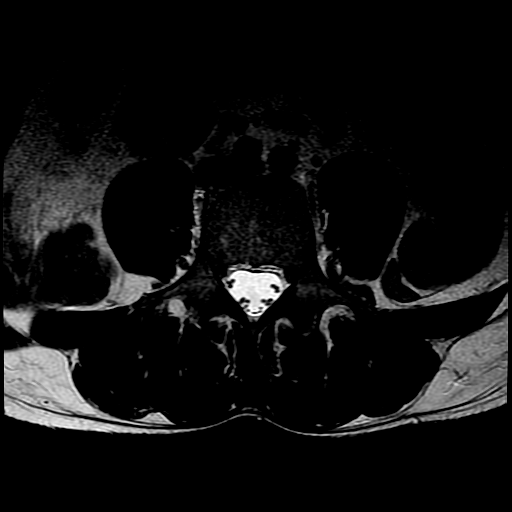
[im 19/36]
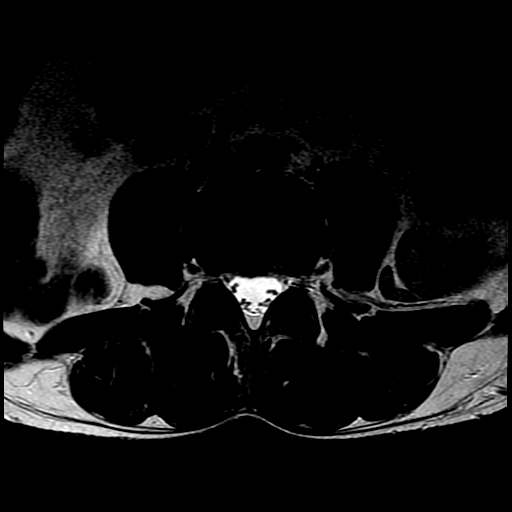
[im 22/36]
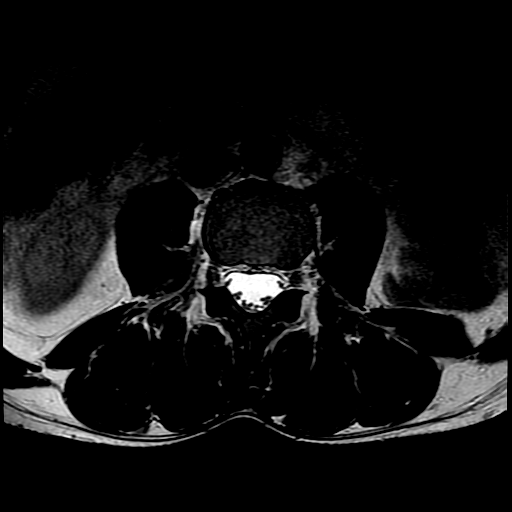
[im 31/36]
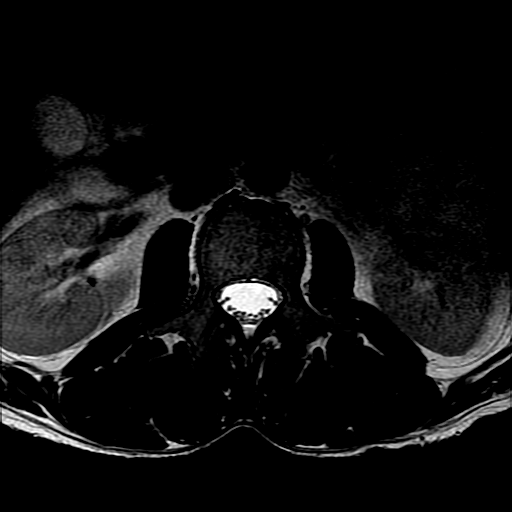

[Series 7: T1 · axial · 4.0mm · 0.39mm/px · z∈[-102,+34]mm · 3 of 36 slices shown (2 of 2)]
[im 5/36]
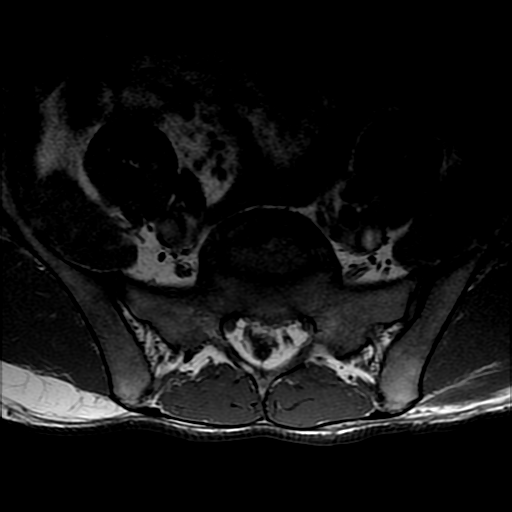
[im 19/36]
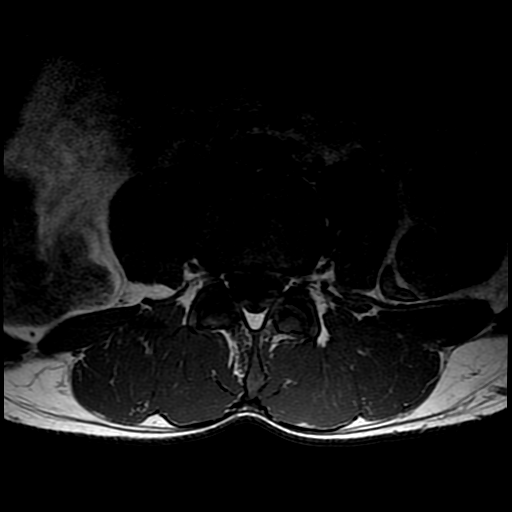
[im 31/36]
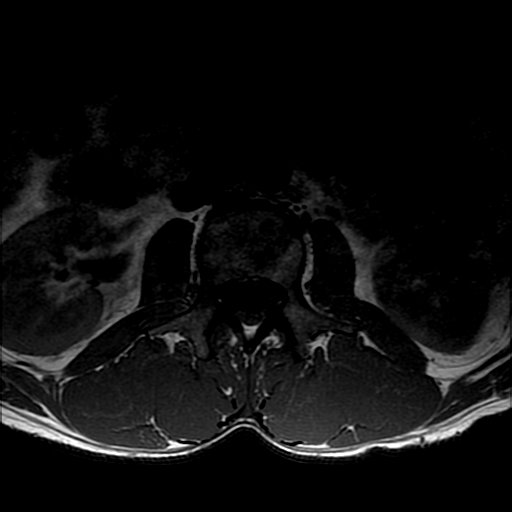

[19 of 48 positions shown; findings below may reference images not displayed]

FINDINGS: Segmentation:  Standard.

Alignment:  Physiologic.

Vertebrae:  No fracture, evidence of discitis, or bone lesion.

Conus medullaris and cauda equina: Conus extends to the T12 level.
Conus and cauda equina appear normal.

Paraspinal and other soft tissues: No acute paraspinal abnormality.

Disc levels:

Disc spaces: Degenerative disc disease mild disc height loss and
disc desiccation at L3-4 and L4-5.

T12-L1: No significant disc bulge. No evidence of neural foraminal
stenosis. No central canal stenosis.

L1-L2: No significant disc bulge. No evidence of neural foraminal
stenosis. No central canal stenosis.

L2-L3: Mild broad-based disc bulge. No evidence of neural foraminal
stenosis. No central canal stenosis.

L3-L4: Mild broad-based disc bulge with a small right paracentral
disc protrusion. No evidence of neural foraminal stenosis. No
central canal stenosis.

L4-L5: Broad-based disc bulge. Bilateral lateral recess stenosis. No
evidence of neural foraminal stenosis. No central canal stenosis.

L5-S1: No significant disc bulge. No evidence of neural foraminal
stenosis. No central canal stenosis.
IMPRESSION: 1. At L4-5 there is a broad-based disc bulge. Bilateral lateral
recess stenosis.
2. At L3-4 there is a mild broad-based disc bulge with a small right
paracentral disc protrusion.

## 2019-11-08 ENCOUNTER — Emergency Department (HOSPITAL_COMMUNITY): Payer: Self-pay

## 2019-11-08 ENCOUNTER — Other Ambulatory Visit: Payer: Self-pay

## 2019-11-08 ENCOUNTER — Emergency Department (HOSPITAL_COMMUNITY)
Admission: EM | Admit: 2019-11-08 | Discharge: 2019-11-08 | Disposition: A | Discharge status: Home / Self Care | Payer: Self-pay | Attending: Emergency Medicine | Admitting: Emergency Medicine

## 2019-11-08 ENCOUNTER — Encounter (HOSPITAL_COMMUNITY): Payer: Self-pay | Admitting: Emergency Medicine

## 2019-11-08 DIAGNOSIS — M25512 Pain in left shoulder: Secondary | ICD-10-CM | POA: Insufficient documentation

## 2019-11-08 DIAGNOSIS — Y9389 Activity, other specified: Secondary | ICD-10-CM | POA: Insufficient documentation

## 2019-11-08 DIAGNOSIS — Y9289 Other specified places as the place of occurrence of the external cause: Secondary | ICD-10-CM | POA: Insufficient documentation

## 2019-11-08 DIAGNOSIS — Y990 Civilian activity done for income or pay: Secondary | ICD-10-CM | POA: Insufficient documentation

## 2019-11-08 DIAGNOSIS — Z5321 Procedure and treatment not carried out due to patient leaving prior to being seen by health care provider: Secondary | ICD-10-CM | POA: Insufficient documentation

## 2019-11-08 DIAGNOSIS — W1789XA Other fall from one level to another, initial encounter: Secondary | ICD-10-CM | POA: Insufficient documentation

## 2019-11-08 NOTE — ED Triage Notes (Signed)
Patient arrives to ED with complaints of falling off machinery at work today and getting his left shoulder caught causing him to hyperextend the shoulder for a brief moment. Patient states he cannot reach behind himself with the arm.

## 2019-12-22 ENCOUNTER — Encounter (HOSPITAL_COMMUNITY): Payer: Self-pay

## 2019-12-22 ENCOUNTER — Emergency Department (HOSPITAL_COMMUNITY): Payer: Self-pay

## 2019-12-22 ENCOUNTER — Observation Stay (HOSPITAL_COMMUNITY)
Admission: EM | Admit: 2019-12-22 | Discharge: 2019-12-23 | Disposition: A | Discharge status: Home / Self Care | Payer: Self-pay | Attending: Family Medicine | Admitting: Family Medicine

## 2019-12-22 ENCOUNTER — Other Ambulatory Visit: Payer: Self-pay

## 2019-12-22 DIAGNOSIS — M5440 Lumbago with sciatica, unspecified side: Secondary | ICD-10-CM | POA: Insufficient documentation

## 2019-12-22 DIAGNOSIS — R778 Other specified abnormalities of plasma proteins: Secondary | ICD-10-CM | POA: Diagnosis present

## 2019-12-22 DIAGNOSIS — R4182 Altered mental status, unspecified: Secondary | ICD-10-CM | POA: Diagnosis present

## 2019-12-22 DIAGNOSIS — E872 Acidosis, unspecified: Secondary | ICD-10-CM | POA: Diagnosis present

## 2019-12-22 DIAGNOSIS — R000 Tachycardia, unspecified: Secondary | ICD-10-CM | POA: Insufficient documentation

## 2019-12-22 DIAGNOSIS — R509 Fever, unspecified: Principal | ICD-10-CM | POA: Insufficient documentation

## 2019-12-22 DIAGNOSIS — R7989 Other specified abnormal findings of blood chemistry: Secondary | ICD-10-CM | POA: Diagnosis present

## 2019-12-22 DIAGNOSIS — F17210 Nicotine dependence, cigarettes, uncomplicated: Secondary | ICD-10-CM | POA: Insufficient documentation

## 2019-12-22 DIAGNOSIS — Z20822 Contact with and (suspected) exposure to covid-19: Secondary | ICD-10-CM | POA: Insufficient documentation

## 2019-12-22 DIAGNOSIS — A419 Sepsis, unspecified organism: Secondary | ICD-10-CM

## 2019-12-22 DIAGNOSIS — R748 Abnormal levels of other serum enzymes: Secondary | ICD-10-CM | POA: Insufficient documentation

## 2019-12-22 DIAGNOSIS — G8929 Other chronic pain: Secondary | ICD-10-CM | POA: Diagnosis present

## 2019-12-22 DIAGNOSIS — Z720 Tobacco use: Secondary | ICD-10-CM

## 2019-12-22 DIAGNOSIS — N179 Acute kidney failure, unspecified: Secondary | ICD-10-CM | POA: Diagnosis present

## 2019-12-22 DIAGNOSIS — R6510 Systemic inflammatory response syndrome (SIRS) of non-infectious origin without acute organ dysfunction: Secondary | ICD-10-CM | POA: Diagnosis present

## 2019-12-22 HISTORY — DX: Tobacco use: Z72.0

## 2019-12-22 LAB — CBC WITH DIFFERENTIAL/PLATELET
Abs Immature Granulocytes: 0.19 10*3/uL — ABNORMAL HIGH (ref 0.00–0.07)
Basophils Absolute: 0.1 10*3/uL (ref 0.0–0.1)
Basophils Relative: 0 %
Eosinophils Absolute: 0 10*3/uL (ref 0.0–0.5)
Eosinophils Relative: 0 %
HCT: 50.8 % (ref 39.0–52.0)
Hemoglobin: 17.3 g/dL — ABNORMAL HIGH (ref 13.0–17.0)
Immature Granulocytes: 1 %
Lymphocytes Relative: 4 %
Lymphs Abs: 0.7 10*3/uL (ref 0.7–4.0)
MCH: 30.8 pg (ref 26.0–34.0)
MCHC: 34.1 g/dL (ref 30.0–36.0)
MCV: 90.4 fL (ref 80.0–100.0)
Monocytes Absolute: 1 10*3/uL (ref 0.1–1.0)
Monocytes Relative: 5 %
Neutro Abs: 19.4 10*3/uL — ABNORMAL HIGH (ref 1.7–7.7)
Neutrophils Relative %: 90 %
Platelets: 274 10*3/uL (ref 150–400)
RBC: 5.62 MIL/uL (ref 4.22–5.81)
RDW: 12.7 % (ref 11.5–15.5)
WBC: 21.4 10*3/uL — ABNORMAL HIGH (ref 4.0–10.5)
nRBC: 0 % (ref 0.0–0.2)

## 2019-12-22 LAB — URINALYSIS, ROUTINE W REFLEX MICROSCOPIC
Bacteria, UA: NONE SEEN
Bilirubin Urine: NEGATIVE
Glucose, UA: NEGATIVE mg/dL
Ketones, ur: NEGATIVE mg/dL
Leukocytes,Ua: NEGATIVE
Nitrite: NEGATIVE
Protein, ur: 100 mg/dL — AB
Specific Gravity, Urine: 1.018 (ref 1.005–1.030)
pH: 5 (ref 5.0–8.0)

## 2019-12-22 LAB — RAPID URINE DRUG SCREEN, HOSP PERFORMED
Amphetamines: NOT DETECTED
Barbiturates: NOT DETECTED
Benzodiazepines: NOT DETECTED
Cocaine: NOT DETECTED
Opiates: NOT DETECTED
Tetrahydrocannabinol: NOT DETECTED

## 2019-12-22 LAB — COMPREHENSIVE METABOLIC PANEL
ALT: 30 U/L (ref 0–44)
AST: 32 U/L (ref 15–41)
Albumin: 4.7 g/dL (ref 3.5–5.0)
Alkaline Phosphatase: 65 U/L (ref 38–126)
Anion gap: 14 (ref 5–15)
BUN: 16 mg/dL (ref 6–20)
CO2: 23 mmol/L (ref 22–32)
Calcium: 8.6 mg/dL — ABNORMAL LOW (ref 8.9–10.3)
Chloride: 101 mmol/L (ref 98–111)
Creatinine, Ser: 1.9 mg/dL — ABNORMAL HIGH (ref 0.61–1.24)
GFR calc Af Amer: 48 mL/min — ABNORMAL LOW (ref >60)
GFR calc non Af Amer: 41 mL/min — ABNORMAL LOW (ref >60)
Glucose, Bld: 130 mg/dL — ABNORMAL HIGH (ref 70–99)
Potassium: 4.9 mmol/L (ref 3.5–5.1)
Sodium: 138 mmol/L (ref 135–145)
Total Bilirubin: 0.4 mg/dL (ref 0.3–1.2)
Total Protein: 8 g/dL (ref 6.5–8.1)

## 2019-12-22 LAB — PROCALCITONIN: Procalcitonin: 1.57 ng/mL

## 2019-12-22 LAB — LACTIC ACID, PLASMA
Lactic Acid, Venous: 3.4 mmol/L (ref 0.5–1.9)
Lactic Acid, Venous: 2.1 mmol/L (ref 0.5–1.9)

## 2019-12-22 LAB — CBG MONITORING, ED: Glucose-Capillary: 123 mg/dL — ABNORMAL HIGH (ref 70–99)

## 2019-12-22 LAB — PROTIME-INR
INR: 1 (ref 0.8–1.2)
Prothrombin Time: 12.9 seconds (ref 11.4–15.2)

## 2019-12-22 LAB — MAGNESIUM: Magnesium: 1.9 mg/dL (ref 1.7–2.4)

## 2019-12-22 LAB — PHOSPHORUS: Phosphorus: 4.3 mg/dL (ref 2.5–4.6)

## 2019-12-22 LAB — ACETAMINOPHEN LEVEL: Acetaminophen (Tylenol), Serum: <10 ug/mL — ABNORMAL LOW (ref 10–30)

## 2019-12-22 LAB — TROPONIN I (HIGH SENSITIVITY)
Troponin I (High Sensitivity): 61 ng/L — ABNORMAL HIGH (ref <18)
Troponin I (High Sensitivity): 79 ng/L — ABNORMAL HIGH (ref <18)

## 2019-12-22 LAB — SALICYLATE LEVEL: Salicylate Lvl: <7 mg/dL — ABNORMAL LOW (ref 7.0–30.0)

## 2019-12-22 LAB — SARS CORONAVIRUS 2 BY RT PCR (HOSPITAL ORDER, PERFORMED IN ~~LOC~~ HOSPITAL LAB): SARS Coronavirus 2: NEGATIVE

## 2019-12-22 LAB — ETHANOL: Alcohol, Ethyl (B): <10 mg/dL (ref <10)

## 2019-12-22 LAB — APTT: aPTT: 29 seconds (ref 24–36)

## 2019-12-22 LAB — CK: Total CK: 326 U/L (ref 49–397)

## 2019-12-22 MED ORDER — VANCOMYCIN HCL 500 MG/100ML IV SOLN
500.0000 mg | Freq: Two times a day (BID) | INTRAVENOUS | Status: DC
  Filled 2019-12-22 (×7): qty 100

## 2019-12-22 MED ORDER — PROCHLORPERAZINE EDISYLATE 10 MG/2ML IJ SOLN: 5.0000 mg | INTRAMUSCULAR | Status: DC | PRN

## 2019-12-22 MED ORDER — LACTATED RINGERS IV SOLN: INTRAVENOUS | Status: DC

## 2019-12-22 MED ORDER — METRONIDAZOLE IN NACL 5-0.79 MG/ML-% IV SOLN
500.0000 mg | Freq: Three times a day (TID) | INTRAVENOUS | Status: DC
  Administered 2019-12-23: 500 mg via INTRAVENOUS
  Filled 2019-12-22: qty 100

## 2019-12-22 MED ORDER — NALOXONE HCL 0.4 MG/ML IJ SOLN: 0.2000 mg | Freq: Once | INTRAMUSCULAR | Status: AC

## 2019-12-22 MED ORDER — SODIUM CHLORIDE 0.9 % IV SOLN
2.0000 g | Freq: Once | INTRAVENOUS | Status: AC
  Administered 2019-12-22: 2 g via INTRAVENOUS
  Filled 2019-12-22: qty 2

## 2019-12-22 MED ORDER — ACETAMINOPHEN 650 MG RE SUPP: 650.0000 mg | Freq: Four times a day (QID) | RECTAL | Status: DC | PRN

## 2019-12-22 MED ORDER — ACETAMINOPHEN 325 MG PO TABS
650.0000 mg | ORAL_TABLET | Freq: Four times a day (QID) | ORAL | Status: DC | PRN
  Administered 2019-12-22: 650 mg via ORAL
  Filled 2019-12-22: qty 2

## 2019-12-22 MED ORDER — NALOXONE HCL 0.4 MG/ML IJ SOLN
INTRAMUSCULAR | Status: AC
  Administered 2019-12-22: 0.2 mg via INTRAVENOUS
  Filled 2019-12-22: qty 1

## 2019-12-22 MED ORDER — SODIUM CHLORIDE 0.9 % IV SOLN: INTRAVENOUS | Status: DC

## 2019-12-22 MED ORDER — SODIUM CHLORIDE 0.9 % IV SOLN
2.0000 g | Freq: Two times a day (BID) | INTRAVENOUS | Status: DC
  Administered 2019-12-23: 2 g via INTRAVENOUS
  Filled 2019-12-22: qty 2

## 2019-12-22 MED ORDER — LACTATED RINGERS IV BOLUS (SEPSIS)
1000.0000 mL | Freq: Once | INTRAVENOUS | Status: AC
  Administered 2019-12-22: 1000 mL via INTRAVENOUS

## 2019-12-22 MED ORDER — LACTATED RINGERS IV BOLUS (SEPSIS)
250.0000 mL | Freq: Once | INTRAVENOUS | Status: AC
  Administered 2019-12-22: 250 mL via INTRAVENOUS

## 2019-12-22 MED ORDER — ENOXAPARIN SODIUM 40 MG/0.4ML ~~LOC~~ SOLN
40.0000 mg | SUBCUTANEOUS | Status: DC
  Administered 2019-12-22: 40 mg via SUBCUTANEOUS
  Filled 2019-12-22: qty 0.4

## 2019-12-22 MED ORDER — METRONIDAZOLE IN NACL 5-0.79 MG/ML-% IV SOLN
500.0000 mg | Freq: Once | INTRAVENOUS | Status: AC
  Administered 2019-12-22: 500 mg via INTRAVENOUS
  Filled 2019-12-22: qty 100

## 2019-12-22 MED ORDER — VANCOMYCIN HCL IN DEXTROSE 1-5 GM/200ML-% IV SOLN
1000.0000 mg | Freq: Once | INTRAVENOUS | Status: AC
  Administered 2019-12-22: 1000 mg via INTRAVENOUS
  Filled 2019-12-22: qty 200

## 2019-12-22 MED ORDER — SODIUM CHLORIDE 0.9 % IV BOLUS
1000.0000 mL | Freq: Once | INTRAVENOUS | Status: AC
  Administered 2019-12-22: 1000 mL via INTRAVENOUS
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
## 2019-12-22 NOTE — ED Notes (Signed)
Date and time results received: 12/22/19 1951  Test: Lactic Acid Critical Value: 3.4  Name of Provider Notified: Army Melia, PA  Orders Received? Or Actions Taken?: Actions Taken: n/a

## 2019-12-22 NOTE — Progress Notes (Signed)
Pharmacy Antibiotic Note  Gabriel Sharp is a 49 y.o. male admitted on 12/22/2019 with sepsis - unknown source.  Pharmacy has been consulted for vancomycin and cefepime dosing.  Vancomycin 1 g IV x 1 and cefepime 2 g IV x 1 ordered in ED  Plan: Continue Vancomycin 500 mg IV every 12 hours.  Goal trough 15-20 mcg/mL.  Continue cefepime 2 g IV q12h Monitor clinical picture, renal function, vancomycin trough if indicated F/U C&S, abx deescalation / LOT   Height: 5\' 11"  (180.3 cm) Weight: 68 kg (150 lb) IBW/kg (Calculated) : 75.3  Temp (24hrs), Avg:100 F (37.8 C), Min:100 F (37.8 C), Max:100 F (37.8 C)  Recent Labs  Lab 12/22/19 1836  WBC 21.4*  CREATININE 1.90*  LATICACIDVEN 3.4*    Estimated Creatinine Clearance: 46.2 mL/min (A) (by C-G formula based on SCr of 1.9 mg/dL (H)).    No Known Allergies    Thank you for allowing pharmacy to be a part of this patient's care.  02/21/20, PharmD, BCPS 12/22/2019 9:10 PM
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
## 2019-12-23 LAB — CBC WITH DIFFERENTIAL/PLATELET
Abs Immature Granulocytes: 0.04 10*3/uL (ref 0.00–0.07)
Basophils Absolute: 0 10*3/uL (ref 0.0–0.1)
Basophils Relative: 0 %
Eosinophils Absolute: 0 10*3/uL (ref 0.0–0.5)
Eosinophils Relative: 0 %
HCT: 39 % (ref 39.0–52.0)
Hemoglobin: 13 g/dL (ref 13.0–17.0)
Immature Granulocytes: 0 %
Lymphocytes Relative: 14 %
Lymphs Abs: 1.7 10*3/uL (ref 0.7–4.0)
MCH: 30 pg (ref 26.0–34.0)
MCHC: 33.3 g/dL (ref 30.0–36.0)
MCV: 89.9 fL (ref 80.0–100.0)
Monocytes Absolute: 0.9 10*3/uL (ref 0.1–1.0)
Monocytes Relative: 7 %
Neutro Abs: 9.9 10*3/uL — ABNORMAL HIGH (ref 1.7–7.7)
Neutrophils Relative %: 79 %
Platelets: 228 10*3/uL (ref 150–400)
RBC: 4.34 MIL/uL (ref 4.22–5.81)
RDW: 12.4 % (ref 11.5–15.5)
WBC: 12.6 10*3/uL — ABNORMAL HIGH (ref 4.0–10.5)
nRBC: 0 % (ref 0.0–0.2)

## 2019-12-23 LAB — COMPREHENSIVE METABOLIC PANEL
ALT: 24 U/L (ref 0–44)
AST: 33 U/L (ref 15–41)
Albumin: 3.5 g/dL (ref 3.5–5.0)
Alkaline Phosphatase: 40 U/L (ref 38–126)
Anion gap: 8 (ref 5–15)
BUN: 14 mg/dL (ref 6–20)
CO2: 23 mmol/L (ref 22–32)
Calcium: 7.6 mg/dL — ABNORMAL LOW (ref 8.9–10.3)
Chloride: 103 mmol/L (ref 98–111)
Creatinine, Ser: 1.07 mg/dL (ref 0.61–1.24)
GFR calc Af Amer: >60 mL/min (ref >60)
GFR calc non Af Amer: >60 mL/min (ref >60)
Glucose, Bld: 106 mg/dL — ABNORMAL HIGH (ref 70–99)
Potassium: 3.9 mmol/L (ref 3.5–5.1)
Sodium: 134 mmol/L — ABNORMAL LOW (ref 135–145)
Total Bilirubin: 0.8 mg/dL (ref 0.3–1.2)
Total Protein: 5.7 g/dL — ABNORMAL LOW (ref 6.5–8.1)

## 2019-12-23 LAB — HIV ANTIBODY (ROUTINE TESTING W REFLEX): HIV Screen 4th Generation wRfx: NONREACTIVE

## 2019-12-23 LAB — LACTIC ACID, PLASMA: Lactic Acid, Venous: 1.2 mmol/L (ref 0.5–1.9)

## 2019-12-23 MED ORDER — VANCOMYCIN HCL 750 MG/150ML IV SOLN
750.0000 mg | Freq: Two times a day (BID) | INTRAVENOUS | Status: DC
  Filled 2019-12-23 (×7): qty 150

## 2019-12-23 MED ORDER — SODIUM CHLORIDE 0.9 % IV SOLN: 2.0000 g | Freq: Three times a day (TID) | INTRAVENOUS | Status: DC
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
## 2019-12-23 NOTE — ED Notes (Signed)
Dr. Mariea Clonts reviewed risks of leaving AMA with patient and pt verbalized understanding. PT left refusing to sign AMA form.

## 2019-12-27 LAB — CULTURE, BLOOD (ROUTINE X 2)
Culture: NO GROWTH
Culture: NO GROWTH
Special Requests: ADEQUATE
Special Requests: ADEQUATE

## 2021-09-22 ENCOUNTER — Other Ambulatory Visit: Payer: Self-pay

## 2021-09-22 ENCOUNTER — Encounter (HOSPITAL_COMMUNITY): Payer: Self-pay | Admitting: Emergency Medicine

## 2021-09-22 ENCOUNTER — Emergency Department (HOSPITAL_COMMUNITY)
Admission: EM | Admit: 2021-09-22 | Discharge: 2021-09-22 | Disposition: A | Discharge status: Home / Self Care | Payer: Self-pay | Attending: Emergency Medicine | Admitting: Emergency Medicine

## 2021-09-22 DIAGNOSIS — W57XXXA Bitten or stung by nonvenomous insect and other nonvenomous arthropods, initial encounter: Secondary | ICD-10-CM | POA: Insufficient documentation

## 2021-09-22 DIAGNOSIS — S31815A Open bite of right buttock, initial encounter: Secondary | ICD-10-CM | POA: Insufficient documentation

## 2021-09-22 DIAGNOSIS — L03317 Cellulitis of buttock: Secondary | ICD-10-CM | POA: Insufficient documentation

## 2021-09-22 DIAGNOSIS — I10 Essential (primary) hypertension: Secondary | ICD-10-CM | POA: Insufficient documentation

## 2021-09-22 MED ORDER — DOXYCYCLINE HYCLATE 100 MG PO CAPS: 100.0000 mg | ORAL_CAPSULE | Freq: Two times a day (BID) | ORAL | 0 refills | Status: DC

## 2021-09-22 NOTE — ED Provider Notes (Signed)
?  [facility]ERGENCY DEPARTMENT ?Provider Note ? ? ?CSN: [id number] ?Arrival date & time: 09/22/21  1442 ? ?  ? ?History ? ?Chief Complaint  ?Patient presents with  ? Insect Bite  ? ? ?Gabriel Sharp is a 51 y.o. male. ? ?HPI ? ?This patient is a 51 year old male no chronic medical history presenting with right buttock redness after having what he thought was an insect bite.  The area became hard swollen and warm, his girlfriend popped it with her fingers and a large amount of pus came out now it is less swollen, he has no fevers ? ?Home Medications ?Prior to Admission medications   ?Medication Sig Start Date End Date Taking? Authorizing Provider  ?doxycycline (VIBRAMYCIN) 100 MG capsule Take 1 capsule (100 mg total) by mouth 2 (two) times daily. 09/22/21  Yes Eber Hong, MD  ?   ? ?Allergies    ?Patient has no known allergies.   ? ?Review of Systems   ?Review of Systems  ?Constitutional:  Negative for fever.  ?Skin:  Positive for rash.  ? ?Physical Exam ?Updated Vital Signs ?BP (!) 137/99 (BP Location: Right Arm)   Pulse 92   Temp 98.4 ?F (36.9 ?C) (Oral)   Resp 16   Ht 1.803 m (5\' 11" )   Wt 68 kg   SpO2 99%   BMI 20.92 kg/m?  ?Physical Exam ?Vitals and nursing note reviewed.  ?Constitutional:   ?   Appearance: He is well-developed. He is not diaphoretic.  ?HENT:  ?   Head: Normocephalic and atraumatic.  ?Eyes:  ?   General:     ?   Right eye: No discharge.     ?   Left eye: No discharge.  ?   Conjunctiva/sclera: Conjunctivae normal.  ?Pulmonary:  ?   Effort: Pulmonary effort is normal. No respiratory distress.  ?Skin: ?   General: Skin is warm and dry.  ?   Findings: No erythema or rash.  ?   Comments: 3 cm diameter flat red mildly tender, no area of ulceration no foul smell, no induration, no purulence, located on the right medial buttock, nothing perianal  ?Neurological:  ?   Mental Status: He is alert.  ?   Coordination: Coordination normal.  ? ? ?ED Results / Procedures / Treatments   ?Labs ?(all labs  ordered are listed, but only abnormal results are displayed) ?Labs Reviewed - No data to display ? ?EKG ?None ? ?Radiology ?No results found. ? ?Procedures ?Procedures  ? ? ?Medications Ordered in ED ?Medications - No data to display ? ?ED Course/ Medical Decision Making/ A&P ?  ?                        ?Medical Decision Making ?Risk ?Prescription drug management. ? ? ?Cellulitis after self treated abscess, Doxy for 1 week, vital signs unremarkable, no fever no tachycardia, mild hypertension, can recheck with family doctor, patient agreeable ? ? ? ? ? ? ? ?Final Clinical Impression(s) / ED Diagnoses ?Final diagnoses:  ?Insect bite, unspecified site, initial encounter  ?Cellulitis of buttock  ? ? ?Rx / DC Orders ?ED Discharge Orders   ? ?      Ordered  ?  doxycycline (VIBRAMYCIN) 100 MG capsule  2 times daily       ? 09/22/21 1607  ? ?  ?  ? ?  ? ? ?  ?11/22/21, MD ?09/22/21 1609 ? ?

## 2021-09-22 NOTE — Discharge Instructions (Signed)
Doxycycline 100mg  by mouth twice daily until the medicine is completely finished - this medicine is a strong antibiotic that treats certain infections.   ? ?

## 2021-09-22 NOTE — ED Triage Notes (Signed)
Patient c/o possible "spider bite" to right buttock that was noticed on Friday. Per patient started as a "hard knot." Per patient area has increased in size and had some drainage with some discoloration. Denies any fevers.  ?

## 2023-04-09 ENCOUNTER — Other Ambulatory Visit: Payer: Self-pay

## 2023-04-09 ENCOUNTER — Emergency Department (HOSPITAL_COMMUNITY): Payer: Self-pay

## 2023-04-09 ENCOUNTER — Encounter (HOSPITAL_COMMUNITY): Payer: Self-pay

## 2023-04-09 ENCOUNTER — Emergency Department (HOSPITAL_COMMUNITY)
Admission: EM | Admit: 2023-04-09 | Discharge: 2023-04-09 | Disposition: A | Discharge status: Home / Self Care | Payer: Self-pay | Attending: Emergency Medicine | Admitting: Emergency Medicine

## 2023-04-09 DIAGNOSIS — S61431A Puncture wound without foreign body of right hand, initial encounter: Secondary | ICD-10-CM | POA: Insufficient documentation

## 2023-04-09 DIAGNOSIS — Z23 Encounter for immunization: Secondary | ICD-10-CM | POA: Insufficient documentation

## 2023-04-09 DIAGNOSIS — W5501XA Bitten by cat, initial encounter: Secondary | ICD-10-CM | POA: Insufficient documentation

## 2023-04-09 DIAGNOSIS — Z720 Tobacco use: Secondary | ICD-10-CM | POA: Insufficient documentation

## 2023-04-09 DIAGNOSIS — S61531A Puncture wound without foreign body of right wrist, initial encounter: Secondary | ICD-10-CM | POA: Insufficient documentation

## 2023-04-09 MED ORDER — TETANUS-DIPHTH-ACELL PERTUSSIS 5-2.5-18.5 LF-MCG/0.5 IM SUSY
0.5000 mL | PREFILLED_SYRINGE | Freq: Once | INTRAMUSCULAR | Status: AC
  Administered 2023-04-09: 0.5 mL via INTRAMUSCULAR
  Filled 2023-04-09: qty 0.5

## 2023-04-09 MED ORDER — AMOXICILLIN-POT CLAVULANATE 875-125 MG PO TABS
1.0000 | ORAL_TABLET | Freq: Once | ORAL | Status: AC
  Administered 2023-04-09: 1 via ORAL
  Filled 2023-04-09: qty 1

## 2023-04-09 MED ORDER — AMOXICILLIN-POT CLAVULANATE 875-125 MG PO TABS: 1.0000 | ORAL_TABLET | Freq: Two times a day (BID) | ORAL | 0 refills | Status: AC

## 2023-04-09 MED ORDER — BACITRACIN ZINC 500 UNIT/GM EX OINT
TOPICAL_OINTMENT | Freq: Once | CUTANEOUS | Status: AC
  Administered 2023-04-09: 1 via TOPICAL
  Filled 2023-04-09: qty 0.9

## 2023-04-09 NOTE — ED Provider Notes (Signed)
[place] EMERGENCY DEPARTMENT AT [facility] Provider Note   CSN: [id number] Arrival date & time: 04/09/23  1705     History  Chief Complaint  Patient presents with   Animal Bite    Gabriel Sharp is a 51 y.o. male.  Patient with noncontributory past medical history presents today with complaints of cat bite.  He states that same occurred this morning when he was bitten by a stray cat that was underneath the scar.  He notes that he was bitten on the right wrist he has pain and swelling to same.  He did clean the area well and applied antibiotic cream.  He also called animal control and they have placed animal traps around his house.  He is unsure of any details regarding this cat and is therefore unsure if it has had any vaccinations.  He is unsure of his last tetanus.  He has never been vaccinated for rabies.  He is able to range his hand with some pain.  Denies any numbness or tingling in his fingertips.  No fevers or chills.  The history is provided by the patient. No language interpreter was used.  Animal Bite      Home Medications Prior to Admission medications   Medication Sig Start Date End Date Taking? Authorizing Provider  doxycycline (VIBRAMYCIN) 100 MG capsule Take 1 capsule (100 mg total) by mouth 2 (two) times daily. 09/22/21   Eber Hong, MD      Allergies    Patient has no known allergies.    Review of Systems   Review of Systems  Skin:  Positive for wound.  All other systems reviewed and are negative.   Physical Exam Updated Vital Signs BP (!) 163/93 (BP Location: Left Arm)   Pulse (!) 104   Temp 98.8 F (37.1 C)   Resp 20   Ht 5\' 11"  (1.803 m)   Wt 68 kg   SpO2 96%   BMI 20.92 kg/m  Physical Exam Vitals and nursing note reviewed.  Constitutional:      General: He is not in acute distress.    Appearance: Normal appearance. He is normal weight. He is not ill-appearing, toxic-appearing or diaphoretic.  HENT:     Head: Normocephalic  and atraumatic.  Cardiovascular:     Rate and Rhythm: Normal rate.  Pulmonary:     Effort: Pulmonary effort is normal. No respiratory distress.  Musculoskeletal:        General: Normal range of motion.     Cervical back: Normal range of motion.  Skin:    General: Skin is warm and dry.     Comments: 4 puncture wounds noted to the medial and palmar aspect of the right hand and wrist.  Associated swelling and mild erythema noted.  No signs of foreign body.  No purulent drainage.  He is able to fully range his hand with some discomfort.  Good capillary refill and soft compartments.  Neurological:     General: No focal deficit present.     Mental Status: He is alert.  Psychiatric:        Mood and Affect: Mood normal.        Behavior: Behavior normal.     ED Results / Procedures / Treatments   Labs (all labs ordered are listed, but only abnormal results are displayed) Labs Reviewed - No data to display  EKG None  Radiology DG Wrist Complete Right  Result Date: 04/09/2023 CLINICAL DATA:  Animal bite.  Puncture wounds. EXAM: RIGHT WRIST - COMPLETE 4 VIEW COMPARISON:  None Available. FINDINGS: No fracture or dislocation. Preserved joint spaces and bone mineralization. No radiopaque foreign bodies identified. There is significant soft tissue swelling about the lateral aspect of the hand and thumb as seen on the lateral view dorsally of the distal metacarpal levels. IMPRESSION: Significant soft tissue swelling. No definite radiopaque foreign body or underlying erosive changes. Additional evaluation with a cross-sectional imaging as clinically appropriate Electronically Signed   By: Karen Kays M.D.   On: 04/09/2023 18:34    Procedures Procedures    Medications Ordered in ED Medications  amoxicillin-clavulanate (AUGMENTIN) 875-125 MG per tablet 1 tablet (1 tablet Oral Given 04/09/23 2049)  bacitracin ointment (1 Application Topical Given 04/09/23 2048)  Tdap (BOOSTRIX) injection 0.5 mL  (0.5 mLs Intramuscular Given 04/09/23 2048)    ED Course/ Medical Decision Making/ A&P                                 Medical Decision Making Amount and/or Complexity of Data Reviewed Radiology: ordered.  Risk OTC drugs. Prescription drug management.   This patient is a 52 y.o. male  who presents to the ED for concern of cat bite.   Differential diagnoses prior to evaluation: The emergent differential diagnosis includes, but is not limited to,  trauma, infection, retained foreign body . This is not an exhaustive differential.   Past Medical History / Co-morbidities / Social History:  has a past medical history of Sciatica and Tobacco use (12/22/2019).  Additional history: Chart reviewed.  Physical Exam: Physical exam performed. The pertinent findings include: Per above, 4 puncture wounds noted to the medial and palmar aspect of the right wrist.  Mild underlying erythema without purulence. No foreign body presence.  Good ROM, and capillary refill.  Imaging studies: X-ray imaging ordered by triage staff prior to my evaluation of the patient's right wrist.  This imaging has resulted and reveals  Significant soft tissue swelling. No definite radiopaque foreign body or underlying erosive changes. Additional evaluation with a cross-sectional imaging as clinically appropriate  I have personally reviewed and interpreted this imaging and agree with radiology interpretation.  Patient has full range of motion of his hand, therefore no indication for cross-sectional imaging at this time.  Medications: I ordered medication including Augmentin, Tdap, and bacitracin ointment with wound care.  I have reviewed the patients home medicines and have made adjustments as needed.   Disposition: After consideration of the diagnostic results and the patients response to treatment, I feel that emergency department workup does not suggest an emergent condition requiring admission or immediate  intervention beyond what has been performed at this time. The plan is: Discharge with Augmentin for cat bite.  I did offer rabies vaccination, risk versus benefits thoroughly discussed.  Patient would prefer to not have this treatment at this time.  Recommend thorough wound care with soap and water and daily dressing changes.  Referral to hand specialist given as needed.  Close return precautions given as well.  Evaluation and diagnostic testing in the emergency department does not suggest an emergent condition requiring admission or immediate intervention beyond what has been performed at this time.  Plan for discharge with close PCP follow-up.  Patient is understanding and amenable with plan, educated on red flag symptoms that would prompt immediate return.  Patient discharged in stable condition.  Final Clinical Impression(s) / ED Diagnoses Final diagnoses:  Cat bite, initial encounter  Need for tetanus booster    Rx / DC Orders ED Discharge Orders          Ordered    amoxicillin-clavulanate (AUGMENTIN) 875-125 MG tablet  Every 12 hours        04/09/23 2138          An After Visit Summary was printed and given to the patient.     Vear Clock 04/09/23 2143    Bethann Berkshire, MD 04/10/23 1121

## 2023-04-09 NOTE — ED Triage Notes (Signed)
Bite by stray cat that was under the car this morning Pt already reported to animal control  RIGHT wrist 4 puncture wounds Swelling and redness noted in triage   Denies numbness and tingling in fingers

## 2023-04-09 NOTE — Discharge Instructions (Addendum)
As we discussed, your workup in the ER today was reassuring for acute findings per x-ray imaging did not reveal any emergent concerns.  However, given that cat bites have a high risk of infection, I have given you a prescription for an antibiotic Augmentin for you to take as prescribed in its entirety to help prevent a developing infection.  We have given you the first dose of this this evening.  It is incredibly important that you complete the course.  I also recommend that you go home and clean your wound thoroughly with soap and water and then apply the bacitracin ointment that we gave you as well as a dressing.  Please keep it dry clean and dry as much as possible.  I have given you a referral to a hand specialist with a number call to schedule follow-up appointment if this wound is not getting any better.  Additionally, we did discuss giving you the rabies vaccine today but you have opted to not have this treatment.  If you change your mind, you can return at any time to have this vaccination.  Please return if you have any worsening swelling in your hand, redness, warmth, yellow drainage, fever/chills, or increasing limitations in mobility of your hand.  Return if development of any new or worsening symptoms.

## 2023-10-10 ENCOUNTER — Emergency Department (HOSPITAL_COMMUNITY)
Admission: EM | Admit: 2023-10-10 | Discharge: 2023-10-10 | Disposition: A | Discharge status: Home / Self Care | Payer: Self-pay | Attending: Emergency Medicine | Admitting: Emergency Medicine

## 2023-10-10 ENCOUNTER — Encounter (HOSPITAL_COMMUNITY): Payer: Self-pay | Admitting: *Deleted

## 2023-10-10 ENCOUNTER — Other Ambulatory Visit: Payer: Self-pay

## 2023-10-10 DIAGNOSIS — W57XXXA Bitten or stung by nonvenomous insect and other nonvenomous arthropods, initial encounter: Secondary | ICD-10-CM | POA: Insufficient documentation

## 2023-10-10 DIAGNOSIS — S30862A Insect bite (nonvenomous) of penis, initial encounter: Secondary | ICD-10-CM | POA: Insufficient documentation

## 2023-10-10 MED ORDER — HYDROXYZINE HCL 25 MG PO TABS: 25.0000 mg | ORAL_TABLET | Freq: Four times a day (QID) | ORAL | 0 refills | Status: AC | PRN

## 2023-10-10 MED ORDER — PREDNISONE 10 MG PO TABS: 40.0000 mg | ORAL_TABLET | Freq: Every day | ORAL | 0 refills | Status: AC

## 2023-10-10 MED ORDER — DOXYCYCLINE HYCLATE 100 MG PO CAPS: 100.0000 mg | ORAL_CAPSULE | Freq: Two times a day (BID) | ORAL | 0 refills | Status: AC

## 2023-10-10 MED ORDER — HYDROXYZINE HCL 25 MG PO TABS
25.0000 mg | ORAL_TABLET | Freq: Once | ORAL | Status: AC
  Administered 2023-10-10: 25 mg via ORAL
  Filled 2023-10-10: qty 1

## 2023-10-10 NOTE — Discharge Instructions (Signed)
 Lease take all medications as directed.  Follow-up closely with a primary care doctor on an outpatient basis.  Return to emergency department immediately for any new or worsening symptoms.  [facility] Primary Care Doctor List    Alberteen Huge, MD. Specialty: [facility] Medicine Contact information: [address]  [place] [address]  [phone]   Charlotta Cook, MD. Specialty: [facility] Medicine Contact information: [address]  [place] [address]  [phone]   Clary Crown, MD Specialty: Internal Medicine Contact information: [address] [place] [address]  [phone]   Lucendia Rusk, MD. Specialty: Internal Medicine Contact information: [address]  [place] [address]  [phone]    [facility] (Dr. Burdette Carolin) Specialty: Family Medicine Contact information: [address]  [place] [address]  [phone]   Darrall Ellison, MD. Specialty: [facility] Medicine Contact information: [address]  [address]  [place] [address]  [phone]   Artemisa Bile, MD. Specialty: Internal Medicine Contact information: [address]  [address]  [place] [address]  [phone]   [facility] Family Medicine: [address] [phone]  Selene Dais, Family medicine [address]  [phone]  [facility] [address] [place], [place] [phone]  [facility]: [address] [phone]    [facility]  [address] [place], [place] [address] [phone]  Services [facility] offers a variety of basic health services.  Services include but are not limited to: Blood pressure checks  Heart rate checks  Blood sugar checks  Urine analysis  Rapid strep tests  Pregnancy tests.  Health education and referrals  People needing more complex services will be directed to a physician online. Using these  virtual visits, doctors can evaluate and prescribe medicine and treatments. There will be no medication on-site, though [facility] will help patients fill their prescriptions at little to no cost.   For More information please go to: [email or web address]  Allergy and Asthma:    [address] Selene Dais [phone]  Urology:  [address]  [place] [phone]  [facility]  [address] [place], [place] [phone]  Orthopedics   [address] [place], [place] [phone]  Endocrinology  [address] [place], [place] [phone]  Podiatry: [facility] [phone]

## 2023-10-10 NOTE — ED Provider Notes (Signed)
 [place] EMERGENCY DEPARTMENT AT [facility] Provider Note   CSN: [id number] Arrival date & time: 10/10/23  1300     History  Chief Complaint  Patient presents with   Groin Swelling    Anterrio Mccleery is a 53 y.o. male.  Patient is a 53 year old male who presents to the emergency department with a chief complaint of a tick bite to the proximal aspect of his penis.  He notes that he pulled a tick off of him earlier this morning.  He notes that since that time he has developed redness and swelling at the site.  He denies any associated fever, chills, joint aches.  He notes that he is experiencing pruritus at the site as well.  He notes that he was able to fully remove the tick with the head intact.        Home Medications Prior to Admission medications   Medication Sig Start Date End Date Taking? Authorizing Provider  doxycycline  (VIBRAMYCIN ) 100 MG capsule Take 1 capsule (100 mg total) by mouth 2 (two) times daily. 10/10/23  Yes Roselynn Connors, PA-C  hydrOXYzine (ATARAX) 25 MG tablet Take 1 tablet (25 mg total) by mouth every 6 (six) hours as needed for itching. 10/10/23  Yes Angelyna Henderson, Veryl Gottron D, PA-C  predniSONE  (DELTASONE ) 10 MG tablet Take 4 tablets (40 mg total) by mouth daily for 5 days. 10/10/23 10/15/23 Yes Roselynn Connors, PA-C  amoxicillin -clavulanate (AUGMENTIN ) 875-125 MG tablet Take 1 tablet by mouth every 12 (twelve) hours. 04/09/23   Smoot, Genevive Ket, PA-C      Allergies    Patient has no known allergies.    Review of Systems   Review of Systems  Skin:  Positive for rash.  All other systems reviewed and are negative.   Physical Exam Updated Vital Signs BP (!) 144/118 (BP Location: Right Arm)   Pulse 94   Temp 98.4 F (36.9 C) (Oral)   Resp 16   Ht 5\' 11"  (1.803 m)   Wt 68 kg   SpO2 99%   BMI 20.92 kg/m  Physical Exam Vitals and nursing note reviewed.  Constitutional:      Appearance: Normal appearance.  HENT:     Head:  Normocephalic and atraumatic.     Nose: Nose normal.     Mouth/Throat:     Mouth: Mucous membranes are moist.  Eyes:     Extraocular Movements: Extraocular movements intact.     Conjunctiva/sclera: Conjunctivae normal.     Pupils: Pupils are equal, round, and reactive to light.  Cardiovascular:     Rate and Rhythm: Normal rate and regular rhythm.     Pulses: Normal pulses.     Heart sounds: Normal heart sounds.  Pulmonary:     Effort: Pulmonary effort is normal. No respiratory distress.     Breath sounds: Normal breath sounds. No stridor. No wheezing, rhonchi or rales.  Genitourinary:    Comments: Area of tick bite noted to the proximal dorsal aspect of the penis with surrounding erythema and swelling, no areas of induration or fluctuance Musculoskeletal:        General: Normal range of motion.     Cervical back: Normal range of motion and neck supple.  Skin:    General: Skin is warm and dry.  Neurological:     General: No focal deficit present.     Mental Status: He is alert and oriented to person, place, and time. Mental status is at baseline.  Psychiatric:  Mood and Affect: Mood normal.        Behavior: Behavior normal.        Thought Content: Thought content normal.        Judgment: Judgment normal.     ED Results / Procedures / Treatments   Labs (all labs ordered are listed, but only abnormal results are displayed) Labs Reviewed - No data to display  EKG None  Radiology No results found.  Procedures Procedures    Medications Ordered in ED Medications  hydrOXYzine (ATARAX) tablet 25 mg (has no administration in time range)    ED Course/ Medical Decision Making/ A&P                                 Medical Decision Making Patient is doing well at this time and is stable for discharge home.  Appears that he is suffering from an acute allergic process at this point secondary to the tick bite.  Will still cover accordingly on outpatient basis for the  tick bite.  Will provide coverage for his allergic process at this time.  Patient has no indication for abscess formation.  Tick does appear to be fully removed with no retained foreign bodies noted on physical exam.  Close follow-up with primary care doctor was discussed as well as strict return precautions for any new or worsening symptoms.  Patient voiced understanding and had no additional questions.  Risk Prescription drug management.           Final Clinical Impression(s) / ED Diagnoses Final diagnoses:  Tick bite of penis, initial encounter    Rx / DC Orders ED Discharge Orders          Ordered    predniSONE  (DELTASONE ) 10 MG tablet  Daily        10/10/23 1337    doxycycline  (VIBRAMYCIN ) 100 MG capsule  2 times daily        10/10/23 1337    hydrOXYzine (ATARAX) 25 MG tablet  Every 6 hours PRN        10/10/23 1337              Emmalene Hare 10/10/23 1349    Early Glisson, MD 10/11/23 1836

## 2023-10-10 NOTE — ED Triage Notes (Signed)
 Pt removed a tick from groin area and hours later began to have swelling. + itching

## 2023-10-16 ENCOUNTER — Emergency Department (HOSPITAL_COMMUNITY)
Admission: EM | Admit: 2023-10-16 | Discharge: 2023-10-16 | Discharge status: Against Medical Advice | Payer: Self-pay | Attending: Emergency Medicine | Admitting: Emergency Medicine

## 2023-10-16 ENCOUNTER — Other Ambulatory Visit: Payer: Self-pay

## 2023-10-16 ENCOUNTER — Encounter (HOSPITAL_COMMUNITY): Payer: Self-pay

## 2023-10-16 DIAGNOSIS — Y93G1 Activity, food preparation and clean up: Secondary | ICD-10-CM | POA: Insufficient documentation

## 2023-10-16 DIAGNOSIS — W25XXXA Contact with sharp glass, initial encounter: Secondary | ICD-10-CM | POA: Insufficient documentation

## 2023-10-16 DIAGNOSIS — S61011A Laceration without foreign body of right thumb without damage to nail, initial encounter: Secondary | ICD-10-CM | POA: Insufficient documentation

## 2023-10-16 DIAGNOSIS — Z5321 Procedure and treatment not carried out due to patient leaving prior to being seen by health care provider: Secondary | ICD-10-CM

## 2023-10-16 NOTE — ED Triage Notes (Signed)
 Pt reports he lacerated his right thumb on a broken glass while doing dishes.
